# Patient Record
Sex: Female | Born: 1965 | Race: White | Hispanic: No | Marital: Single | Smoking: Never smoker
Health system: Southern US, Community
[De-identification: ages and names within clinical notes are randomized; demographics above are authoritative.]

## PROBLEM LIST (undated history)

## (undated) DIAGNOSIS — I10 Essential (primary) hypertension: Secondary | ICD-10-CM

## (undated) DIAGNOSIS — J45909 Unspecified asthma, uncomplicated: Secondary | ICD-10-CM

## (undated) DIAGNOSIS — E079 Disorder of thyroid, unspecified: Secondary | ICD-10-CM

## (undated) HISTORY — DX: Unspecified asthma, uncomplicated: J45.909

## (undated) HISTORY — PX: PARATHYROIDECTOMY: SHX19

---

## 2002-05-17 ENCOUNTER — Emergency Department (HOSPITAL_COMMUNITY): Admission: EM | Admit: 2002-05-17 | Discharge: 2002-05-17 | Payer: Self-pay | Admitting: Emergency Medicine

## 2002-05-18 ENCOUNTER — Emergency Department (HOSPITAL_COMMUNITY): Admission: EM | Admit: 2002-05-18 | Discharge: 2002-05-18 | Payer: Self-pay | Admitting: Emergency Medicine

## 2002-05-19 ENCOUNTER — Encounter: Payer: Self-pay | Admitting: Emergency Medicine

## 2002-10-04 ENCOUNTER — Ambulatory Visit (HOSPITAL_COMMUNITY): Admission: RE | Admit: 2002-10-04 | Discharge: 2002-10-04 | Payer: Self-pay | Admitting: Endocrinology

## 2002-10-04 ENCOUNTER — Encounter: Payer: Self-pay | Admitting: Endocrinology

## 2003-01-27 ENCOUNTER — Encounter: Payer: Self-pay | Admitting: Urology

## 2003-02-01 ENCOUNTER — Inpatient Hospital Stay (HOSPITAL_COMMUNITY): Admission: RE | Admit: 2003-02-01 | Discharge: 2003-02-03 | Payer: Self-pay | Admitting: Urology

## 2003-02-01 ENCOUNTER — Encounter: Payer: Self-pay | Admitting: Urology

## 2003-02-05 ENCOUNTER — Emergency Department (HOSPITAL_COMMUNITY): Admission: EM | Admit: 2003-02-05 | Discharge: 2003-02-05 | Payer: Self-pay | Admitting: Emergency Medicine

## 2003-02-10 ENCOUNTER — Encounter: Payer: Self-pay | Admitting: Urology

## 2003-02-10 ENCOUNTER — Ambulatory Visit (HOSPITAL_BASED_OUTPATIENT_CLINIC_OR_DEPARTMENT_OTHER): Admission: RE | Admit: 2003-02-10 | Discharge: 2003-02-10 | Payer: Self-pay | Admitting: Urology

## 2003-03-04 ENCOUNTER — Ambulatory Visit (HOSPITAL_COMMUNITY): Admission: RE | Admit: 2003-03-04 | Discharge: 2003-03-04 | Payer: Self-pay | Admitting: Urology

## 2003-03-04 ENCOUNTER — Ambulatory Visit (HOSPITAL_BASED_OUTPATIENT_CLINIC_OR_DEPARTMENT_OTHER): Admission: RE | Admit: 2003-03-04 | Discharge: 2003-03-04 | Payer: Self-pay | Admitting: Urology

## 2003-03-08 ENCOUNTER — Encounter: Payer: Self-pay | Admitting: Surgery

## 2003-03-08 ENCOUNTER — Ambulatory Visit (HOSPITAL_COMMUNITY): Admission: RE | Admit: 2003-03-08 | Discharge: 2003-03-08 | Payer: Self-pay | Admitting: Neurology

## 2003-04-19 ENCOUNTER — Encounter (INDEPENDENT_AMBULATORY_CARE_PROVIDER_SITE_OTHER): Payer: Self-pay | Admitting: Specialist

## 2003-04-19 ENCOUNTER — Observation Stay (HOSPITAL_COMMUNITY): Admission: RE | Admit: 2003-04-19 | Discharge: 2003-04-20 | Payer: Self-pay | Admitting: Surgery

## 2003-09-19 ENCOUNTER — Ambulatory Visit (HOSPITAL_COMMUNITY): Admission: RE | Admit: 2003-09-19 | Discharge: 2003-09-19 | Payer: Self-pay | Admitting: Surgery

## 2003-10-05 ENCOUNTER — Ambulatory Visit (HOSPITAL_COMMUNITY): Admission: RE | Admit: 2003-10-05 | Discharge: 2003-10-05 | Payer: Self-pay | Admitting: Urology

## 2003-10-05 ENCOUNTER — Ambulatory Visit (HOSPITAL_BASED_OUTPATIENT_CLINIC_OR_DEPARTMENT_OTHER): Admission: RE | Admit: 2003-10-05 | Discharge: 2003-10-05 | Payer: Self-pay | Admitting: Urology

## 2003-10-10 ENCOUNTER — Ambulatory Visit (HOSPITAL_COMMUNITY): Admission: RE | Admit: 2003-10-10 | Discharge: 2003-10-10 | Payer: Self-pay | Admitting: Surgery

## 2003-11-16 ENCOUNTER — Ambulatory Visit (HOSPITAL_COMMUNITY): Admission: RE | Admit: 2003-11-16 | Discharge: 2003-11-16 | Payer: Self-pay | Admitting: Urology

## 2003-11-16 ENCOUNTER — Ambulatory Visit (HOSPITAL_BASED_OUTPATIENT_CLINIC_OR_DEPARTMENT_OTHER): Admission: RE | Admit: 2003-11-16 | Discharge: 2003-11-16 | Payer: Self-pay | Admitting: Urology

## 2003-11-27 ENCOUNTER — Emergency Department (HOSPITAL_COMMUNITY): Admission: EM | Admit: 2003-11-27 | Discharge: 2003-11-27 | Payer: Self-pay | Admitting: Emergency Medicine

## 2003-11-29 ENCOUNTER — Ambulatory Visit (HOSPITAL_BASED_OUTPATIENT_CLINIC_OR_DEPARTMENT_OTHER): Admission: RE | Admit: 2003-11-29 | Discharge: 2003-11-29 | Payer: Self-pay | Admitting: Urology

## 2003-11-30 ENCOUNTER — Observation Stay (HOSPITAL_COMMUNITY): Admission: RE | Admit: 2003-11-30 | Discharge: 2003-12-01 | Payer: Self-pay | Admitting: Surgery

## 2003-11-30 ENCOUNTER — Encounter (INDEPENDENT_AMBULATORY_CARE_PROVIDER_SITE_OTHER): Payer: Self-pay | Admitting: *Deleted

## 2003-12-03 ENCOUNTER — Inpatient Hospital Stay (HOSPITAL_COMMUNITY): Admission: EM | Admit: 2003-12-03 | Discharge: 2003-12-07 | Payer: Self-pay | Admitting: Emergency Medicine

## 2003-12-28 ENCOUNTER — Ambulatory Visit (HOSPITAL_BASED_OUTPATIENT_CLINIC_OR_DEPARTMENT_OTHER): Admission: RE | Admit: 2003-12-28 | Discharge: 2003-12-28 | Payer: Self-pay | Admitting: Urology

## 2003-12-29 ENCOUNTER — Inpatient Hospital Stay (HOSPITAL_COMMUNITY): Admission: EM | Admit: 2003-12-29 | Discharge: 2003-12-31 | Payer: Self-pay | Admitting: Emergency Medicine

## 2004-01-01 ENCOUNTER — Inpatient Hospital Stay (HOSPITAL_COMMUNITY): Admission: AD | Admit: 2004-01-01 | Discharge: 2004-01-05 | Payer: Self-pay | Admitting: Urology

## 2004-01-12 ENCOUNTER — Ambulatory Visit (HOSPITAL_COMMUNITY): Admission: RE | Admit: 2004-01-12 | Discharge: 2004-01-12 | Payer: Self-pay | Admitting: Urology

## 2004-01-19 ENCOUNTER — Inpatient Hospital Stay (HOSPITAL_COMMUNITY): Admission: EM | Admit: 2004-01-19 | Discharge: 2004-01-21 | Payer: Self-pay | Admitting: Urology

## 2004-02-20 ENCOUNTER — Inpatient Hospital Stay (HOSPITAL_COMMUNITY): Admission: RE | Admit: 2004-02-20 | Discharge: 2004-02-24 | Payer: Self-pay | Admitting: Urology

## 2004-06-23 ENCOUNTER — Ambulatory Visit (HOSPITAL_COMMUNITY): Admission: RE | Admit: 2004-06-23 | Discharge: 2004-06-23 | Payer: Self-pay | Admitting: Family Medicine

## 2007-12-25 ENCOUNTER — Emergency Department (HOSPITAL_COMMUNITY): Admission: EM | Admit: 2007-12-25 | Discharge: 2007-12-26 | Payer: Self-pay | Admitting: Emergency Medicine

## 2010-01-01 ENCOUNTER — Emergency Department (HOSPITAL_COMMUNITY): Admission: EM | Admit: 2010-01-01 | Discharge: 2010-01-01 | Payer: Self-pay | Admitting: Family Medicine

## 2010-07-01 ENCOUNTER — Encounter: Payer: Self-pay | Admitting: Neurosurgery

## 2010-08-25 LAB — URINE CULTURE

## 2010-08-25 LAB — POCT URINALYSIS DIP (DEVICE)
Bilirubin Urine: NEGATIVE
Glucose, UA: NEGATIVE mg/dL
Ketones, ur: NEGATIVE mg/dL
Protein, ur: NEGATIVE mg/dL
Urobilinogen, UA: 0.2 mg/dL (ref 0.0–1.0)

## 2010-10-26 NOTE — Op Note (Signed)
[name], [name]                             ACCOUNT NO.:  [id number]   MEDICAL RECORD NO.:  [id number]                   PATIENT TYPE:  OBV   LOCATION:  0444                                 FACILITY:  [facility]   PHYSICIAN:  Velora Heckler, M.D.                DATE OF BIRTH:  1965/08/05   DATE OF PROCEDURE:  04/19/2003  DATE OF DISCHARGE:                                 OPERATIVE REPORT   PREOPERATIVE DIAGNOSIS:  Primary hyperparathyroidism.   POSTOPERATIVE DIAGNOSIS:  Primary hyperparathyroidism.   OPERATION/PROCEDURE:  1. Neck exploration.  2. Left thyroid lobectomy.   SURGEON:  Velora Heckler, M.D.   ASSISTANT:  Adolph Pollack, M.D.   ANESTHESIA:  General.   ESTIMATED BLOOD LOSS:  50 mL.   PREPARATION:  Betadine.   COMPLICATIONS:  None.   INDICATIONS:  The patient is a 45 year old white female with primary  hyperparathyroidism.  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  She has had various urologic procedures.  She has  been evaluated by Dr. Ardyth Harps.  She was found to have an elevated serum  calcium level of 10.7 with an elevated intact PTH level of 118.  She  underwent Sestamibi scan.  This failed to localize parathyroid adenoma.  Followup laboratory studies on February 28, 2003 demonstrated an elevated  intact PTH level of 266 with a serum calcium level of 10.4.  MRI scan was  obtained.  This showed a 7 x 5 mm well-circumscribed nodule at the posterior  aspect of the upper lobe of the thyroid on the left side.  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  The patient is now  brought to the operating room for neck exploration.   FINDINGS:  At exploration, no such nodular density could be identified on  the left side of the neck.  A full exploration of the neck was carried out.  A normal-appearing left inferior parathyroid gland was identified.  A normal-  appearing right superior parathyroid gland  was identified.  Left thyroid  lobe was resected based on the absence of a left superior gland and the fact  that the MRI scan had indicated an abnormality in the left neck.   DESCRIPTION OF PROCEDURE:  The procedure was done in OR #6 at the [facility].  The patient was brought to the operating room and  placed in the supine position on the operating room table.  Following  administration of general anesthesia, the patient is positioned and then  prepped and draped in the usual strict aseptic fashion.   After ascertaining that an adequate level of anesthesia had been obtained, a  left neck incision is made in a skin fold for approximately 3 cm.  An  attempt is made to perform a minimally invasive approach. Dissection is  carried  down through the platysma.  Hemostasis is obtained with the  electrocautery.  Strap muscles are incised in the midline.  Strap muscles  are reflected off the left thyroid lobe.  Left thyroid lobe is mobilized.  The venous tributaries are divided between small Ligaclips.  Exploration  behind the left thyroid lobe failed to reveal any evidence of parathyroid  adenoma.  The decision was, therefore, made to proceed with full neck  exploration.  Incision was extended across the midline to the right side  along the natural skin lines.  Dissection was carried down through the  platysma on the right as well as the left.  Skin flaps were elevated  cephalad and caudad from the thyroid notch to the sternal notch.  A May-  Horner self-retaining retractor is placed for exposure.  Strap muscles are  again reflected to the left and the left thyroid lobe is fully dissected  out.  The left superior pole vessels are identified.  Dissection is carried  up onto the side of the larynx.  Carotid sheath is opened and explored.  Inferiorly the dissection is carried to the precervical fascia and behind  the esophagus.  Recurrent laryngeal nerve is identified and  preserved.  Inferior thyroid artery is identified and preserved.  Left superior pole of  the thymus is resected and submitted as a specimen.  No evidence of  parathyroid adenoma is identified in the left neck.  Just inferior to the  inferior thyroid artery appears to be a small, approximately 4 mm, normal-  appearing parathyroid gland.  Just superior to the artery, adherent to the  thyroid, is a small, apparent superior parathyroid gland.  However, on  frozen section biopsy by Dr. Tamala Fothergill, this appears to be thyroid tissue.   Next, we explored the right neck.  Right thyroid lobe appears normal.  It  was rolled medially.  Strap muscles are reflected laterally.  Venous  tributaries are divided between small Ligaclips.  Thorough exploration of  the right neck including the carotid sheath, the superior pole, the  retroesophageal space, and along the tracheoesophageal groove reveals no  evidence of parathyroid adenoma.  Further exploration in the left neck again  reveals no sign of the abnormalities seen on MRI scan.  Therefore, a  decision is made to proceed with left thyroid lobectomy.  Superior pole  vessels are ligated in continuity with 2-0 silk ties and medium Ligaclips  and divided.  Gland is rolled medially.  Branches of the inferior thyroid  artery are divided between small and medium Ligaclips.  Ligament of Allyson Sabal is  transected and hemostasis obtained with the electrocautery.  Care is taken  to preserve the recurrent laryngeal nerve throughout its course which is  well visualized.  Gland is rolled further anteriorly.  Inferior venous  tributaries are ligated with 3-0 silk tie and divided.  Gland is mobilized  across the midline under the isthmus and then divided between hemostats.  The remaining thyroid tissue is suture ligated with 3-0 Vicryl suture  ligatures.  Left thyroid lobe is sectioned on the table but no evidence of parathyroid tissue is grossly identified.  Specimen is  submitted to  pathology and discussed with Dr. Tamala Fothergill who will carefully analyze the  left thyroid lobe for evidence of ectopic parathyroid tissue.  Good  hemostasis is obtained.  Surgicel is placed in both sides of the neck over  the area of the recurrent laryngeal nerves.  Strap muscles are  reapproximated in the  midline with interrupted 3-0 Vicryl sutures.  Platysma  is closed with interrupted 3-0 Vicryl sutures.  Skin edges were  reapproximated with interrupted 4-0 Vicryl subcuticular sutures.  Wound was  washed and dried and Steri-Strips are applied.  Sterile gauze dressings are  applied.  The patient is awakened from anesthesia and brought to the  recovery room in stable condition.  The patient tolerated the procedure  well.                                               Velora Heckler, M.D.    TMG/MEDQ  D:  04/19/2003  T:  04/19/2003  Job:  [id number]   cc:   Windy Fast L. Ovidio Hanger, M.D.  [address]  [place]  [place] [address]  Fax: [phone]   Tera Mater. Evlyn Kanner, M.D.  [address]  [place]  [place] [address]  Fax: [phone]   Marjory Lies, M.D.  [address]  [place]  [place] [address]  Fax: [phone]

## 2010-10-26 NOTE — Op Note (Signed)
NAMERorie, [name]                             ACCOUNT NO.:  [id number]   MEDICAL RECORD NO.:  [id number]                   PATIENT TYPE:  AMB   LOCATION:  NESC                                 FACILITY:  [facility]   PHYSICIAN:  Ronald L. Ovidio Hanger, M.D.           DATE OF BIRTH:  04-12-1966   DATE OF PROCEDURE:  10/05/2003  DATE OF DISCHARGE:                                 OPERATIVE REPORT   PREOPERATIVE DIAGNOSIS:  Right nephrolithiasis with some calyceal  obstruction and intermittent pyelonephritis.   POSTOPERATIVE DIAGNOSIS:  Right nephrolithiasis with some calyceal  obstruction and intermittent pyelonephritis.   OPERATION/PROCEDURE:  1. Cystourethroscopy.  2. Placement of right double-J stent.   SURGEON:  Lucrezia Starch. Earlene Plater, M.D.   ANESTHESIA:  LMA.   ESTIMATED BLOOD LOSS:  Negligible.   TUBES:  26 cm x 7-French Cook double-pigtail stent.   COMPLICATIONS:  None.   INDICATIONS FOR PROCEDURE:  [name] is a lovely 45 year old white female  who presented with right nephrolithiasis and underwent right percutaneous  lithotripsy.  It was noted that the stones were regrowing on metabolic work-  up.  She was found to have hyperparathyroidism.  She has undergone a neck  exploration and the adenoma could not be found.  It has been found on  Sestamibi scan.  It appears the adenoma is in the sternal area.  She is to  undergo treatment for that but she has been having intermittent  pyelonephritis and some obstruction of the calyces and the kidney from the  stone which has grown.  It is felt that cysto stent placement is probably  indicated prior to proceeding with the parathyroid adenoma removal and then  once the hyperparathyroidism is under control, we can secondarily treat the  stone.   DESCRIPTION OF PROCEDURE:  The patient was placed in the supine position.  Under proper LMA anesthesia was placed in the dorsal lithotomy position,  prepped and draped with Betadine in the sterile  fashion.  Cystourethroscopy  was performed with a 22.5-French Olympus panendoscope.  Utilizing the 12-  degree and 70-degree lenses, bladder was carefully inspected.  Efflux of  clear urine was noted through the normally placed ureteral orifices  bilaterally.  A 0.038-French, Teflon-coated guidewire was placed into the  right renal pelvis and under  fluoroscopic guidance, a 26 cm x 7-French Cook double-pigtail stent was  placed and noted to be in good position in the right renal pelvis within the  bladder.  Efflux of clear urine was noted from it.  The bladder was drained.  The panendoscope was removed and the patient was taken to the recovery room  stable.  Ronald L. Ovidio Hanger, M.D.    RLD/MEDQ  D:  10/05/2003  T:  10/05/2003  Job:  [id number]

## 2010-10-26 NOTE — Op Note (Signed)
NAMEJAMARIA, AMBORN                             ACCOUNT NO.:  [id number]   MEDICAL RECORD NO.:  [id number]                   PATIENT TYPE:  INP   LOCATION:  0354                                 FACILITY[facility]   PHYSICIAN:  Rozanna Boer., M.D.      DATE OF BIRTH:  30-Jan-1966   DATE OF PROCEDURE:  01/04/2004  DATE OF DISCHARGE:                                 OPERATIVE REPORT   PREOPERATIVE DIAGNOSES:  Bilateral renal stones, bilateral ureteral stents  with obstruction and azotemia.   POSTOPERATIVE DIAGNOSES:  Bilateral renal stones, bilateral ureteral stents  with obstruction and azotemia.   OPERATION:  Cysto and remove and replace bilateral ureteral stents.   ANESTHESIA:  General.   SURGEON:  Courtney Paris, M.D.   BRIEF HISTORY:  This 45 year old patient comes in with bilateral flank pain,  she was admitted July 20 for sounds like left pyelonephritis, had double J  ureteral stents inserted on July 19 by Dr. Darvin Neighbours after he did  ureteroscopy of the left side. She went home and came back 24 hours later  with increasing pain and the ultrasound and KUB shows that the stent on the  right was coiled up in the proximal ureter and with her creatinine going  from 1.9 up to 2.5, I felt that there was at least some obstruction on the  right side and needed to have the stents changed. She was also having a lot  of pain requiring Dilaudid PCA also indicating possibly there was a problem  with the left stent as well.   The patient was placed on the operating table in dorsal lithotomy position  after satisfactory induction of general anesthesia was prepped and draped  with Betadine in the usual sterile fashion.  The panendoscope was inserted  and the stent coming from the right orifice seemed a little long but was  pulled out outside the urethral meatus and a guidewire was then passed up  the stent under fluoroscopy to the level of the kidney. The stent was then  removed.  A new 7 French x 26 cm length double J ureteral stent was then  passed over the guidewire under fluoroscopy. It seemed to coil up in the  proximal ureter and when I tried to adjust this, it had to be replaced. I  then grasped the stent, pulled it out and then over the cystoscope passed a  guidewire again, this time passed it over the cystoscope and was able to  position this looks like what would have been the renal pelvis on the right  side.  This little bit longer stent seemed to fit better than the one she  before which was coiled up in the proximal ureter.  The guidewire was then  removed and stent applied and was in good position. The left stent was then  grasped with forceps and pulled outside the urethral meatus and again a  guidewire  was then passed over the stent and the stent was then removed. The  guidewire was then back loaded over the cystoscope and a 7 French x 24 cm  length double J ureteral stent was then passed up this side with the coil on  the renal pelvis and one in the bladder. It seemed to fit quite nicely.  The  stent seemed to be better positioned than they were previously and there was  look like some mucoid debris present in the holes of the stent on the left  side.  The bladder drained, scope removed, the patient taken to the recovery  area in good condition to be later taken up to the room and observed. If her  creatinine comes down and she feels better  without requiring a lot of pain medicines she can probably go home in the  morning. She did have hyperparathyroidism and had a parathyroid removed  under her sternum in June and her calcium remained low and this will be  watched as well.                                               Rozanna Boer., M.D.    HMK/MEDQ  D:  01/04/2004  T:  01/04/2004  Job:  [id number]

## 2010-10-26 NOTE — H&P (Signed)
[name], [name]                   ACCOUNT NO.:  [id number]   MEDICAL RECORD NO.:  [id number]          PATIENT TYPE:  INP   LOCATION:  0353                         FACILITY:  [facility]   PHYSICIAN:  Ronald L. Ovidio Hanger, M.D.DATE OF BIRTH:  [birth year]   DATE OF ADMISSION:  02/20/2004  DATE OF DISCHARGE:  02/24/2004                                HISTORY & PHYSICAL   CHIEF COMPLAINT:  I have kidney stone disease.   HISTORY OF PRESENT ILLNESS:  The patient is a 45 year old female with  history of hyperparathyroidism with subsequent bilateral nephrolithiasis.  She has undergone ureteroscopy extraction of stones on the left side.  She  has had a right percutaneous nephrolithotomy previously but the stones have  recurred and she has subsequently undergone a parathyroidectomy.  After  understanding the risks and alternatives, she would like to proceed with  right percutaneous nephrolithotomy.   PAST MEDICAL HISTORY:  She does have migraine headaches.  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  She has also suffered  pyelonephritis.   ALLERGIES:  SEPTRA, DARVOCET, has had some problems with DILAUDID.   MEDICATIONS:  Urocit-K, blood pressure pills, Levoxyl, Ciprofloxacin,  calcium, Tylox and morphine.   PAST SURGICAL HISTORY:  She has had multiple stone procedures as noted, a  parathyroidectomy as noted.   SOCIAL HISTORY:  She is negative smoker, negative drinker.   FAMILY HISTORY:  Non pertinent.   REVIEW OF SYMPTOMS:  She has had intermittent pyelonephritis as noted, right  flank pain, symptoms related to hypocalcemia that have been treated and  otherwise without other complaints.   PHYSICAL EXAMINATION:  VITAL SIGNS:  Blood pressure 140/100, pulse 79,  respirations 16, temperature 97.71F.  GENERAL:  She is a well-developed, well-nourished, well-groomed.  HEENT:  Normal.  NECK:  Without masses or thyromegaly.  CHEST:  Normal diaphragmatic motion.  ABDOMEN:   Soft, nontender without masses, organomegaly or hernia.  EXTREMITIES:  Normal.  NEUROLOGICAL:  Intact.  SKIN:  Normal.  MUSCULOSKELETAL:  She does have some tenderness in the right flank where the  percutaneous nephrostomy has been placed, otherwise without lesions.   IMPRESSION:  Nephrolithiasis.   PLAN:  Right percutaneous nephrolithotomy.      RLD/MEDQ  D:  03/04/2004  T:  03/04/2004  Job:  [id number]

## 2010-10-26 NOTE — Op Note (Signed)
NAMETERRINA, DOCTER                             ACCOUNT NO.:  [id number]   MEDICAL RECORD NO.:  [id number]                   PATIENT TYPE:  AMB   LOCATION:  NESC                                 FACILITY:  [facility]   PHYSICIAN:  Velora Heckler, M.D.                DATE OF BIRTH:  August 10, 1965   DATE OF PROCEDURE:  11/30/2003  DATE OF DISCHARGE:                                 OPERATIVE REPORT   PREOPERATIVE DIAGNOSIS:  Primary hyperparathyroidism.   POSTOPERATIVE DIAGNOSIS:  Primary hyperparathyroidism.   PROCEDURE:  1. Neck exploration.  2. 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  3. Partial thymectomy.   SURGEON:  Velora Heckler, M.D.   ASSISTANT:  Ines Bloomer, M.D.   ANESTHESIA:  General.   ESTIMATED BLOOD LOSS:  Minimal.   PREPARATION:  Betadine.   COMPLICATIONS:  None.   INDICATIONS FOR PROCEDURE:  The patient is a 45 year old white female with  primary hyperparathyroidism complicated by nephrolithiasis.  The patient had  undergone previous neck exploration for suspected parathyroid adenoma.  The  patient had a left superior and left inferior parathyroid gland identified.  A right superior gland was identified.  A right inferior gland was never  identified.  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  Postoperatively, the patient had recurrent hyperparathyroidism.  Sestamibi  scan demonstrated a suggestion of uptake in the anterior mediastinum.  MRI  scan of the chest showed a 7 mm density behind the manubrium.  The patient  now comes to surgery for neck exploration and possible sternal split with  thymectomy.   DESCRIPTION OF PROCEDURE:  The procedure was done in OR 7 at [facility].  The patient was brought to the operating room and placed  in a supine position on the operating table.  Following administration of  general anesthesia, the patient was prepped and draped in the usual strict  aseptic fashion.   After ascertaining an adequate level of anesthesia had  been obtained, a Kocher incision was reopened with a #10 blade.  Dissection  was carried down through subcutaneous tissues and platysma.  Hemostasis was  obtained with electrocautery.  Skin flaps were developed cephalad and caudad  from the thyroid notch to the sternal notch.  A Mahorner self-retaining  retractor was placed for exposure.  The strap muscles were incised in the  midline.  Dissection was carried down to the trachea.  The right thyroid  lobe was identified.  The left thyroid lobe is surgically absent.  Strap  muscles are reflected to the right exposing the entire right thyroid lobe.  The space behind the right thyroid lobe was not explored due to previous  right superior parathyroid gland and in the interest of avoiding a recurrent  laryngeal nerve injury.  Dissection was then carried inferiorly behind the  manubrium sterni.  Carotid artery on the right  is dissected out and followed  to the innominate artery.  Thymic tissue on the right side is carefully  mobilized.  Anterior compartment lymph nodes are mobilized off the trachea.  Dissection was carried down to the innominate vein which is dissected out  anteriorly and posteriorly.  Left thymic tissue had previously been resected  at her previous neck exploration.  Thymic tissue was then explored.  Just  above the level of the innominate vein, an area of tissue of differing color  from the surrounding thymus is identified.  This is gently dissected out and  hemostasis obtained with the electrocautery.  The lesion is in the proper  location to correspond to a sestamibi scan and MRI scan.  It is of the  appropriate size.  It is completely resected.  It measures approximately 8  by 5 by 4 mm in size.  It is submitted for histopathology and Dr. Berneta Levins confirms parathyroid tissue consistent with parathyroid adenoma.  The  remaining parathyroid tissue above the level of the  innominate vein is  completely resected by Dr. Edwyna Shell.  This is submitted as a separate  specimen.  Good hemostasis was noted.  The strap muscles were reapproximated  in the midline with with interrupted 3-0 Vicryl sutures.  The platysma was  reapproximated with interrupted 3-0 Vicryl sutures.  The skin was closed  with a running 4-0 Vicryl subcuticular suture.  The wound is washed and  dried and Benzoin and Steri-Strips are applied.  Sterile dressings are  applied.  The patient is awakened from anesthesia and brought to the  recovery room in stable condition.  The patient tolerated the procedure  well.                                               Velora Heckler, M.D.    TMG/MEDQ  D:  11/30/2003  T:  11/30/2003  Job:  [id number]   cc:   Ines Bloomer, M.D.  [address]  [place]  [place] [address]   Lucrezia Starch. Ovidio Hanger, M.D.  [address]  [place]  [place] [address]  Fax: [phone]   Marjory Lies, M.D.  [address]  [place]  [place] [address]  Fax: [phone]   Archie Endo, M.D.

## 2010-10-26 NOTE — H&P (Signed)
NAMEJULENA, [name]                             ACCOUNT NO.:  [id number]   MEDICAL RECORD NO.:  [id number]                   PATIENT TYPE:  INP   LOCATION:  0354                                 FACILITY:  [facility]   PHYSICIAN:  Rozanna Boer., M.D.      DATE OF BIRTH:  January 07, 1966   DATE OF ADMISSION:  01/01/2004  DATE OF DISCHARGE:                                HISTORY & PHYSICAL   This 45 year old white female was re-admitted at this time.  She just left  the hospital yesterday  morning after an admission on December 28, 2003 for what  sounds like pyelonephritis.  She has a long history of urinary tract calculi  and on December 27, 2003 underwent cystoscopy and Double-J catheter change by  Windy Fast L. Earlene Plater, M.D.  She was sent home but came back the next day with 102  fevers.  Apparently, the left stones were cleared out.  Although she was on  Cipro, she was febrile and was put on Rocephin and gentamicin.  Blood  culture and urine cultures turned out to be negative.  Her fever  defervesced, and she was sent home yesterday  with bilateral stents.  The  renal ultrasound on December 30, 2003 showed no hydronephrosis.  The stents were  in good position.  She has had low serum calcium levels and has been unable  to tolerate her p.o. 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  She was in the ER.  Her calcium was 6 on December 28, 2003.  She has been  unable to keep anything down the last 12 hours and has been throwing up and  was having some pretty severe back pain not relieved by [employer].  She  is admitted at this time for pain relief and to check her calcium and get  her metabolically stabilized if necessary.   PAST MEDICAL HISTORY:  She has had no cardiac symptoms.  She has had a right  percutaneous nephrolithotomy done in August of 2004.  She had stones that  began in her 69's.  She had five in three years, and then they stopped for a  while until two years ago.  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  She had  to be admitted for a calcium crisis episode just in June of 2004 right after  surgery.   HOSPITAL COURSE:  1. Calcitrol 1200 mg q.i.d.  2. Cipro 250 mg b.i.d.  3. Mepergan Forte for pain.  4. Potassium 8 mEq a day.  5. Thyroid medication.  6. Birth control.   ALLERGIES:  SEPTRA being a rash.   REVIEW OF SYSTEMS:  She has no GI complaints, but has not had a bowel  movement for a couple of days, no history of peptic ulcer disease, or heart,  or lung problems.  SOCIAL HISTORY:  She does not smoke or drink alcohol.   FAMILY HISTORY:  Noncontributory.   PHYSICAL EXAMINATION:  VITAL SIGNS:  Temperature 100.8, blood pressure  120/70, pulse 98, respirations 20.  GENERAL:  She is a strawberry blond, young white female complaining of pain  in her low back.  SKIN: Warm and dry.  CHEST:  Clear.  NECK:  Scars in her neck from previous surgery.  ABDOMEN:  Soft with some mild left greater than right CVA tenderness but no  masses.  PELVIC:  Deferred at this time.  EXTREMITIES:  Negative, no edema, good distal pulses, intact sensation to  light touch.   IMPRESSION:  1. Bilateral ureteral stents with recent pyelonephritis and persisting back     pain.  2. Nausea and vomiting.  3. Low calcium post parathyroid adenoma and partial thyroidectomy and     removal of the thymus in June.   RECOMMEND:  Will check her laboratory studies.  Probably need to give her  some calcium.  Get her pain under control.  Treat the nausea and vomiting,  and evaluate with another KUB and renal ultrasound in the a.m.                                               Rozanna Boer., M.D.    HMK/MEDQ  D:  01/01/2004  T:  01/01/2004  Job:  [id number]

## 2010-10-26 NOTE — Discharge Summary (Signed)
[name], [name]                             ACCOUNT NO.:  [id number]   MEDICAL RECORD NO.:  [id number]                   PATIENT TYPE:  INP   LOCATION:  0362                                 FACILITY:  [facility]   PHYSICIAN:  Ronald L. Ovidio Hanger, M.D.           DATE OF BIRTH:  02-10-1966   DATE OF ADMISSION:  02/01/2003  DATE OF DISCHARGE:  02/03/2003                                 DISCHARGE SUMMARY   DIAGNOSIS:  Right renal staghorn calculus.   OPERATIVE PROCEDURE:  Right percutaneous nephrostolithotomy on February 01, 2003.   HISTORY AND PHYSICAL EXAMINATION:  [name] is a lovely 45 year old white female  who presented with recurrent urinary tract infections and right flank pain.  She was subsequently found to have a right staghorn calculus, and after  understanding risks, benefits, and alternatives elected to proceed with  right percutaneous nephrostomy.  She has been properly treated with  antibiotics, and after a percutaneous nephrostomy had been placed earlier in  the day was admitted for treatment.   PAST MEDICAL HISTORY:  Please see history and physical for full details.   SOCIAL HISTORY:  Please see history and physical for full details.   FAMILY HISTORY:  Please see history and physical for full details.   REVIEW OF SYSTEMS:  Please see history and physical for full details.   PHYSICAL EXAMINATION:  VITAL SIGNS:  She is afebrile.  GENERAL:  Slightly obese, in no acute distress.  Oriented x3.  HEENT:  Normal.  NECK:  Without masses or thyromegaly.  CHEST:  Normal diaphragmatic motion.  ABDOMEN:  Soft, nontender, no mass, organomegaly, or hernias.  She does have  some right CVA tenderness, but no left CVA tenderness.  EXTREMITIES:  Normal.  NEUROLOGIC:  Intact.   HOSPITAL COURSE:  The patient was admitted.  After undergoing proper  preoperative evaluation, was subsequently taken to surgery on February 01, 2003, and underwent right percutaneous nephrostolithotomy  uneventfully.  Postoperatively, she initially did well and was without major complaints,  and by postop day #1, February 02, 2003, was afebrile, tolerating liquids.  She had no severe pain.  Her tubes were patent, but blood tinged, and her  CBC revealed there was no significant bleeding.  White blood cell count was  13.4, hemoglobin 11.8, and hematocrit 33.7.  As she continued to do well, by  February 03, 2003, was without major complaints.  The angiographic catheter  was removed and nephrostomy tube was maintained intact.  She was discharged  to follow up later in the week.  Doing well.   DISCHARGE MEDICATIONS:  1. Cipro.  2. Tylox.  3. A stool softener.   Additionally, she was seeing Dr. Darnell Level who is to follow up in the  office for possible hyperparathyroidism.   CONDITION ON DISCHARGE:  Improved.   Specific instructions were given.  Stone was sent for stone analysis.  Ronald L. Ovidio Hanger, M.D.    RLD/MEDQ  D:  02/22/2003  T:  02/22/2003  Job:  [id number]

## 2010-10-26 NOTE — H&P (Signed)
[name], [name]                             ACCOUNT NO.:  [id number]   MEDICAL RECORD NO.:  [id number]                   PATIENT TYPE:  INP   LOCATION:  0445                                 FACILITY:  [facility]   PHYSICIAN:  Maretta Bees. Vonita Moss, M.D.             DATE OF BIRTH:  24-May-1966   DATE OF ADMISSION:  12/28/2003  DATE OF DISCHARGE:                                HISTORY & PHYSICAL   HISTORY:  This 45 year old white female has a long history of urinary tract  calculi and yesterday underwent cystoscopy and double J catheter change by  Dr. Earlene Plater.  This morning she developed increasing left flank pain, fever to  101.9 along with some nausea and vomiting.  Her urine looked infected and  despite being on Cipro she was spiking fevers.  Urine was cultured and blood  cultures were done.  She was started on gentamicin and Rocephin and admitted  at this time.   Also in the emergency room laboratory studies showed a calcium 6.0 which is  lower than her usual level of 8.  She has had a history of a  parathyroidectomy and has not been able to tolerate her calcium  supplementation for the last few days.  She was given IV calcium in the  emergency room.   PAST MEDICAL HISTORY:  1. Migraine headaches.  2. She had a recent episode of rash from poison ivy.   MEDICATIONS:  1. Levoxyl 75 mg daily for hypothyroidism.  2. Pain medications as needed.  3. She has been on Cipro.  4. She also takes birth control pills.  5. Calcium 1200 mg q.i.d.   ALLERGIES:  SEPTRA, DARVOCET, DILAUDID.   HABITS:  She does not smoke or drink alcohol.   FAMILY HISTORY:  Noncontributory.   REVIEW OF SYSTEMS:  As noted in health history form.   PHYSICAL EXAMINATION:  VITAL SIGNS:  Blood pressure 113/70, pulse 88,  temperature on the floor came down to 98.8.  GENERAL:  She is alert and oriented.  SKIN:  Warm and dry.  ABDOMEN:  Slight guarding in the left flank.  Soft and nontender.  CHEST:  Clear.  HEART:   Tones are normal.  EXTREMITIES:  No lower extremity edema.   IMPRESSION:  1. Left pyelonephritis.  2. Urinary tract calculus disease.  3. Hypocalcemia.  4. Hypothyroidism.  5. History of parathyroidectomy.   PLAN:  1. Await cultures.  2. Intravenous gentamicin and Rocephin.  3. Restart calcium tablets and monitor serum calcium levels.                                               Maretta Bees. Vonita Moss, M.D.    LJP/MEDQ  D:  12/29/2003  T:  12/29/2003  Job:  [id number]

## 2010-10-26 NOTE — Discharge Summary (Signed)
[name], [name]                             ACCOUNT NO.:  [id number]   MEDICAL RECORD NO.:  [id number]                   PATIENT TYPE:  OIB   LOCATION:  0374                                 FACILITY:  [facility]   PHYSICIAN:  Velora Heckler, M.D.                DATE OF BIRTH:  04/23/1966   DATE OF ADMISSION:  12/03/2003  DATE OF DISCHARGE:  12/07/2003                                 DISCHARGE SUMMARY   REASON FOR ADMISSION:  Symptomatic hypocalcemia.   HISTORY OF PRESENT ILLNESS:  The patient is a 45 year old white female with  primary hyperparathyroidism.  The patient had undergone a neck exploration  at [facility] earlier in June.  She underwent excision of a  parathyroid adenoma from the anterior mediastinum.  The patient became  symptomatic with paresthesias.  These progressed to carpopedal spasm.  She  was admitted from the emergency department by Dr. Cyndia Bent for  treatment.   HOSPITAL COURSE:  The patient received intravenous calcium, magnesium, and  potassium supplementation with resolution of her symptoms.  She was followed  with daily laboratory studies.  She was started on aggressive oral  supplementation.  Electrolyte levels had stabilized by the fourth hospital  day and the patient was prepared for discharge.   DISCHARGE PLANNING:  The patient is discharged home on December 07, 2003 in good  condition.  Calcium level is 8.2, magnesium is 1.9, potassium level 3.4.  She will take calcium carbonate 1000 mg q.i.d., Rocaltrol 50 mcg b.i.d., and  potassium chloride 10 mEq daily.   She will have laboratory studies drawn as an outpatient in 48 hours.  She  will return to see me in the office in 1 week.   FINAL DIAGNOSIS:  Hypocalcemia following parathyroidectomy for primary  hyperparathyroidism.   CONDITION ON DISCHARGE:  Improved.                                               Velora Heckler, M.D.    TMG/MEDQ  D:  12/07/2003  T:  12/07/2003  Job:  [id number]

## 2010-10-26 NOTE — Discharge Summary (Signed)
[name], [name]                   ACCOUNT NO.:  [id number]   MEDICAL RECORD NO.:  [id number]          PATIENT TYPE:  INP   LOCATION:  0353                         FACILITY:  [facility]   PHYSICIAN:  Ronald L. Ovidio Hanger, M.D.DATE OF BIRTH:  10/27/1965   DATE OF ADMISSION:  02/20/2004  DATE OF DISCHARGE:  02/24/2004                                 DISCHARGE SUMMARY   DIAGNOSIS:  Right nephrolithiasis.   OPERATIVE PROCEDURE:  Right percutaneous nephrolithotomy on February 20, 2004.   HISTORY AND PHYSICAL EXAMINATION:  [name] is a 45 year old white female  with history of hyperparathyroidism and nephrolithiasis.  She has undergone  multiple procedures, undergone parathyroidectomy, and is for a right  percutaneous nephrolithotomy.  She understands risks, benefits, and  alternatives and wishes to proceed.   PAST MEDICAL HISTORY/SOCIAL HISTORY/FAMILY HISTORY/REVIEW OF SYSTEMS:  Please see history and physical examination for full detail.   PHYSICAL EXAMINATION:  TEMPERATURE:  She is afebrile.  VITAL SIGNS:  Stable.  GENERAL:  Well-nourished, well-developed, well-groomed, in mild distress.  Oriented x 3.  HEAD, EARS, NOSE, THROAT:  Normal.  NECK:  Without masses or thyromegaly.  CHEST:  Has normal diaphragmatic motion.  ABDOMEN:  Soft, nontender, without masses or organomegaly.  She does have  some right CVA tenderness, but a nephrostomy tube is in place.  EXTREMITIES:  Normal.  NEUROLOGIC:  Intact.  SKIN:  Normal.   HOSPITAL COURSE:  The patient was admitted.  After undergoing proper  preoperative evaluation, she was subsequently taken to surgery on February 20, 2004 and underwent a right percutaneous lithotripsy uneventfully.  Immediately postoperatively, she was doing well.  She had to be switched to  Dilaudid PCA due to some discomfort.  She had to have her nephrostomy tube  irrigated and had mild hematuria but it was of not great significance.  She  subsequently continued  to improve by postop day one.  She had some pain and  emesis x 1, but vital signs were stable.  Her Foley catheter was  discontinued, and Dr. Darnell Level was notified to help manage calcium.  Hemoglobin was stable at 10.4.  Creatinine was 1.2.  Diet was advanced.  She  progressively improved, and by February 22, 2004 had T-max of 101.4 degrees  Fahrenheit.  Chem-7 was essentially normal.  Thyroid studies were adequate.  She was changed to p.o. pain medications.  A nephrostogram was performed on  February 23, 2004, and the percutaneous nephrostomy was removed.  She  continued to progressively improved and was subsequently discharged on  February 24, 2004, voiding well, nephrostomy out.  There was a small amount  of drainage from the  nephrostomy tract.  Therefore, an ostomy bag was placed.  She was discharged  on Valium p.r.n. muscle spasms; Tylox; Cipro; and her Levoxyl.  She was to  follow up on Monday for a check and stent removal.   DISCHARGE CONDITION:  Improved.      RLD/MEDQ  D:  03/04/2004  T:  03/05/2004  Job:  [id number]

## 2010-10-26 NOTE — Discharge Summary (Signed)
[name], [name]                             ACCOUNT NO.:  [id number]   MEDICAL RECORD NO.:  [id number]                   PATIENT TYPE:  INP   LOCATION:  0470                                 FACILITY:  [facility]   PHYSICIAN:  Ronald L. Ovidio Hanger, M.D.           DATE OF BIRTH:  Oct 27, 1965   DATE OF ADMISSION:  12/28/2003  DATE OF DISCHARGE:  12/31/2003                                 DISCHARGE SUMMARY   DIAGNOSES:  1. Pyelonephritis.  2. Nephrolithiasis.  3. Hyperparathyroidism.   HISTORY AND PHYSICAL EXAMINATION:  [name] is a lovely 45 year old white female  with a long history of urinary tract calculi.  She has had placement of  double J stents and has had hyperparathyroidism.  She required two neck  explorations and recently has had a parathyroidectomy and has been dealing  with hypocalcemia and hypothyroidism.  She presented with increasing left  flank pain, fever to 101.9 degrees Fahrenheit, nausea and vomiting.  She was  seen by Dr. Vonita Moss in the emergency room, and she was admitted emergently.   Past medical history, social history, family history, review of systems,  please see signed patient medical history sheet for full details.   PHYSICAL EXAMINATION:  VITAL SIGNS:  Blood pressure is 113/70, pulse 88,  temperature is down to 98.8 degrees Fahrenheit.  GENERAL:  She is alert and oriented.  HEENT:  Head, ears, nose, and throat normal.  NECK:  All wounds are well-healed.  ABDOMEN:  Slight guarding, left flank.  Otherwise soft, nontender.  CHEST:  Clear.  CARDIAC:  Normal sinus rhythm without murmurs or gallops.  EXTREMITIES:  Normal.  NEUROLOGIC:  Intact.   HOSPITAL COURSE:  The patient was admitted, placed on pain medication, IV  fluids, and was given Zofran for pain, Rocephin and gentamicin IV.  Pertinent laboratory evaluation revealed a white blood cell count of 18.7,  H&H of 10.3/30.1, and a BMET which revealed a calcium of 6.0.  She was  subsequently placed on 600  mg of calcium plus vitamin D two q.i.d., calcium  gluconate IV, and Rocaltrol p.o.  She was in addition seen by Velora Heckler, M.D., in consultation, who performed the parathyroidectomy, who  managed her calcium.  By December 29, 2003, T-max was 101.3 degrees Fahrenheit,  she was feeling much better.  She had left lower quadrant colic, and it was  felt that she was most likely passing debris.  The calcium was definitely  improved.  By December 30, 2003, creatinine was slightly increased to 1.9.  There was much less pain; however, a renal ultrasound was performed to rule  out obstruction, and there was cortical atrophy of the right kidney and mild  dilatation of the upper pole collecting system and shadowing in the kidney,  but no obvious hydronephrosis was noted on either one.   She continued to improve, was afebrile, was subsequently discharged on December 31, 2003.  Discharge medications included Cipro, Mepergan Fortis, calcium  carbonate, Rocaltrol, and Tums.  She was to see Dr. Gerrit Friends on July 27  and was to follow up in nine days to see me, Dr. Earlene Plater, in the office.  Specific instructions were given.  Discharge condition was improved.  Of  note, cultures were negative except a urine culture, which did reveal  Enterococcus, which was sensitive to Macrodantin, and she was subsequently  began on that and will follow up in the office.                                               Ronald L. Ovidio Hanger, M.D.    RLD/MEDQ  D:  01/22/2004  T:  01/23/2004  Job:  [id number]

## 2010-10-26 NOTE — Op Note (Signed)
NAME:  [name], [name]                             ACCOUNT NO.:  [id number]   MEDICAL RECORD NO.:  [id number]                   PATIENT TYPE:  AMB   LOCATION:  [facility]                                 FACILITY:  [facility]   PHYSICIAN:  Ronald L. Ovidio Hanger, M.D.           DATE OF BIRTH:  12/15/1965   DATE OF PROCEDURE:  11/16/2003  DATE OF DISCHARGE:                                 OPERATIVE REPORT   DIAGNOSIS:  Calcified ureteral stent with hyperparathyroidism.   OPERATIVE PROCEDURE:  1. Cystourethroscopy.  2. Removal of calcified ureteral stent and stones.   SURGEON:  Lucrezia Starch. Earlene Plater, M.D.   ANESTHESIA:  LMA.   ESTIMATED BLOOD LOSS:  Negligible.   TUBES:  None.   COMPLICATIONS:  None.   INDICATIONS FOR PROCEDURE:  [name] is a very nice 45 year old white female,  present with hyperparathyroidism.  She has had a neck exploration with some  parathyroid tissue removed, and it is felt that she has most likely  substernal parathyroid adenoma.  She is to undergo an exploration, possible  median sternotomy later this month.  She has developed progressively  enlarging right renal calculi and had previous removal of the stones, but  the stones have recurred requiring placement of the stent approximately a  month ago.  She came to the office for a check.  She is having urgency,  frequency, and dysuria and on KUB, there is rapid growth of stone on the  distal end of the stent and also some growth on the proximal end of the  stent, obviously with highly lithogenic urine from the hyperparathyroidism.  After understanding risks, benefits, and alternatives elected to proceed  with cystoscopic removal of the stent.  We would like to leave it out until  her parathyroid adenoma has been resected later this month.   PROCEDURE IN DETAIL:  The patient was placed in a supine position.  After  proper LMA anesthesia, was placed in the dorsal lithotomy position and  prepped and draped with Betadine in a sterile  fashion.  Cystourethroscopy  was performed with a 22.5 French Olympus panendoscope, utilizing the 12 and  70 degree lenses.  The bladder was carefully inspected.  No lesions were  noted except for the significant calcification noted on the stent which  appeared to be very new.  The stent was grasped and removed.  Some fragments  came within it.  Certain fragments had to be crunched in the bladder and  removed.  Again, the stent was totally intact, very soft, but with highly  lithogenic urine that obviously formed __________ stone.  The bladder was  drained.  The panendoscope was removed.  The patient was placed on potassium  citrate.  We will leave a stent out and await her procedure later this  month, and then we will plan on stone removing procedures in the right  kidney following resection of her parathyroid adenoma.  Ronald L. Ovidio Hanger, M.D.   RLD/MEDQ  D:  11/16/2003  T:  11/16/2003  Job:  [id number]

## 2010-10-26 NOTE — Op Note (Signed)
[name], [name]                             ACCOUNT NO.:  [id number]   MEDICAL RECORD NO.:  [id number]                   PATIENT TYPE:  INP   LOCATION:  0353                                 FACILITY:  [facility]   PHYSICIAN:  Ronald L. Earlene Plater, M.D.               DATE OF BIRTH:  02-08-1966   DATE OF PROCEDURE:  02/20/2004  DATE OF DISCHARGE:                                 OPERATIVE REPORT   PREOPERATIVE DIAGNOSIS:  Right nephrolithiasis.   POSTOPERATIVE DIAGNOSIS:  Right nephrolithiasis.   PROCEDURES PERFORMED:  1.  Percutaneous dilation of right nephrostomy tract.  2.  Right percutaneous nephrostolithotomy.   SURGEON:  Lucrezia Starch. Earlene Plater, M.D.   RESIDENT SURGEON:  Thyra Breed, M.D.   INTERVENTIONAL RADIOLOGIST:  Dr. Lyman Bishop   ANESTHESIA:  General endotracheal.   ESTIMATED BLOOD LOSS:  250 cc.   DRAINS:  A 16 French Foley catheter to straight drain.  A 22 French  nephrostomy tube to straight drain.   COMPLICATIONS:  None.   INDICATIONS FOR PROCEDURE:  [name] is a 45 year old female with a history  of hyperparathyroidism and subsequent bilateral nephrolithiasis.  In the  past, she has undergone ureteroscopy on her left collecting system for  ureteral nephrolithiasis.  This has been successfully managed, and now she  has a heavy stone burden in her right kidney with a right double-J stent in  place.  Her hyperparathyroidism is being managed by the general surgeons,  who have performed parathyroidectomy.  In fact, she is now on calcium  replacement for hyperparathyroidism.  At this time, we have offered to  provide a right percutaneous nephrostolithotomy for definitive surgical  management of removal of her right kidney stones.  [name] understands the  risks, benefits and alternatives of the procedure, including bleeding,  infection, damage to adjacent structures, future renal problems, loss of  kidney as well as risks of anesthesia.  Informed consent was obtained, and  she is willing to proceed.   PROCEDURE IN DETAIL:  Following identification of her arm bracelet, the  patient was brought to the operating room and placed in supine position.  Here, she received preoperative antibiotics and was underwent successful  induction of general anesthesia.  She was then moved to the prone position  with extreme care taken to pad all pressure points appropriately.  At this  time, she has had a right angiographic catheter exiting her flank previously  placed by the interventional radiologist into the right collecting system.  The angiographic catheter as well as her entire right flank was then prepped  with Betadine and draped in the usual sterile fashion.  Initial spot  fluoroscopy showed a large stone burden within the right renal pelvis as  well as some clustering of stones in a lower pole calyx, which was unable to  be accessed by the interventional radiologist.  The angiographic catheter  was seen to  be going down the ureter and traversed into the bladder.  The  interventional radiologist began by placing a super stiff guidewire down the  angiographic catheter and into the bladder under direct fluoroscopic  guidance.  The angiographic catheter was removed.  A Desilets-Hoffman  dilating sheath was then placed over the super-stiff guidewire and under  direct fluoroscopic guidance into the proximal ureter.  Once in place, the  outer sheath was removed, and a second super stiff guidewire was placed  through the sheath and into the bladder under direct fluoroscopic guidance.  At this time, there were two access wires transversing into the bladder, and  the sheath was removed.  The NephroMax balloon dilator was then placed over  one of the wires and carefully guided under fluoroscopic guidance into the  right renal pelvis.  The nephrostomy tract was then dilated to approximately  30 [place].  The sheath was then fed over the balloon and advanced into the  right renal  pelvis under direct fluoroscopic guidance.  The balloon was  deflated and removed.  We then placed the rigid nephroscope through the  sheath, and the three-prong grasper was used to remove any debris and waste  from the nephrostomy tract from the dilation.  This provided excellent  visualization of the stone as well as abundant matrix material, which  actually irrigated from the sheath quite easily.  The grasper was then used  to remove copious amounts of this matrix material with some stone fragments  within it.  This then identified an approximately 2 cm stone, which was too  large to fit through the sheath.  We then used a Lithoclast Ultra to further  fragment this stone.  In fact, the stone was so soft, the ultrasound portion  of the probe was all that was utilized, which nicely fragmented the stone  and suctioned the remaining fragments from the renal pelvis.  Once the stone  was removed in its entirety, we further searched the right renal pelvis as  best as possible with the rigid nephroscope.  Using spot fluoroscopy as well  as injecting contrast through the sheath, we tried to locate the  infundibulum into this lower pole calyx containing stone.  An angiographic  catheter was also utilized with a glide wire to try and traverse our way  into this calyx using fluoroscopy; however, there appeared to be no entry  point, as the nephrostogram would not fill out this lower pole calyx  containing stone.  There was some rather brisk bleeding within the renal  pelvis at this point.  Given the fact that we had taken care of all the  stone within our access site, we elected to place a nephrostomy tube in the  procedure.  A 22 French Foley catheter was then fed through the access  sheath into the renal pelvis.  The sheath was then peeled from around the  Foley catheter.  Under fluoroscopic guidance, we injected contrast into the Foley catheter and found it to be in good position within the right  renal  pelvis.  Approximately 2.5 cc of sterile fluid was then used to fill the  balloon to further fix the nephrostomy tube within the renal pelvis.  With  one super-stiff guidewire remaining, we placed an angiographic catheter over  the wire and advanced it into the bladder under direct fluoroscopic  guidance.  The guidewire was removed.  A 2-0 silk suture was then used to  affix both the catheter as well as  the nephrostomy tube and the angiographic  catheter to the patient's skin.  The nephrostomy tube was then irrigated of  any remaining blood clots.  Hemostasis appeared to be excellent at this  point.  The area surrounding the nephrostomy tube was then cleansed and  dried.  Sterile dressing was applied followed by Tegaderm.  The patient  tolerated the procedure well, and there were no complications.  Dr. Gaynelle Arabian was present for the entire procedure, as he was the responsible  surgeon.   DISPOSITION:  After awakening from general anesthesia, the patient was  transported to the post anesthesia care unit in stable condition.  From  here, she will be transferred to the floor for further postoperative  observation.     Thyra Breed, MD                            Lucrezia Starch. Earlene Plater, M.D.    EG/MEDQ  D:  02/21/2004  T:  02/21/2004  Job:  [id number]

## 2010-10-26 NOTE — Discharge Summary (Signed)
NAMEKEVYN, WENGERT                             ACCOUNT NO.:  [id number]   MEDICAL RECORD NO.:  [id number]                   PATIENT TYPE:  INP   LOCATION:  0354                                 FACILITY:  [facility]   PHYSICIAN:  Rozanna Boer., M.D.      DATE OF BIRTH:  May 24, 1966   DATE OF ADMISSION:  01/01/2004  DATE OF DISCHARGE:  01/05/2004                                 DISCHARGE SUMMARY   DISCHARGE DIAGNOSES:  1. Bilateral renal stones.  2. Bilateral flank pain with obstruction.  3. 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  4. Nausea and vomiting.   OPERATIONS AND PROCEDURES:  Cystoscopy, bilateral stent removal, and  exchange on January 02, 2004.   HISTORY:  This 45 year old white female was readmitted just a day after  discharge with bilateral flank pain left greater than right.  Had nausea and  vomiting, some recent pyelonephritis after ureteroscopy with bilateral stent  placements by Dr. Earlene Plater on December 27, 2003.  She has had marked hypocalcemia  after parathyroid removal June 2005 and she is admitted now for treatment  and care.  She has been unable to keep anything down for the last 12 hours.   MEDICATIONS:  1. Mepergan Forte.  2. Cipro 250 b.i.d.  3. Calcitriol 1200 mg q.i.d.  4. Thyroid.  5. Birth control pills.  6. K-Dur.   ALLERGIES:  SEPTRA.   PAST MEDICAL HISTORY:  She has had several stones, five in three years in  her early 73s and then had numerous stones in the last two years.  She had a  partial thyroidectomy in November but then she persisted in having the  hyperparathyroidism.  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  Dr. Earlene Plater changed her stents on the 19th as above and  did ureteroscopy of the left side which she had an enterococcus UTI at that  time, then she was readmitted by Dr. Vonita Moss just on January 02, 2004.  She  was  sent home on the 23rd, but then came back with the above symptoms.   LABORATORIES:  Low potassium of 7.3.  Electrolytes were normal otherwise but  calcium was 7.0.  Her white count was 12,900.  Her hematocrit was low at  29%.  She remained afebrile but on PCA Dilaudid and Zofran her nausea and  vomiting was able to be controlled.  She remained afebrile.  Another  ultrasound and KUB showed that the stent on the right was coiled up in the  proximal right ureter and there was still some mild hydronephrosis  bilaterally.  Her creatinine was elevated at 2.5 and for this reason I  elected to go ahead and do a cystoscopy and I removed and changed both  ureteral stents on January 04, 2004.  I used a 7-French by  26 cm on the right  and a 7 x 24 cm stent on the left.  This seemed to have good drainage and  she would have no more pain afterwards.  Her creatinine dropped to 2.0 but  then her calcium was 7.9.  On discharge she will continue her calcitriol.  Because of the  enterococcus I put her on some Macrodantin 50 mg p.o. q.i.d.  She is also on  thyroid, birth control pills, and K-Dur.  She is scheduled for a lithotripsy  a week from today by Dr. Earlene Plater and will keep that appointment.  Sent home in  improved, ambulatory condition on a regular diet.                                               Rozanna Boer., M.D.    HMK/MEDQ  D:  01/05/2004  T:  01/05/2004  Job:  [id number]

## 2010-10-26 NOTE — Op Note (Signed)
NAMESEMIRA, STOLTZFUS                             ACCOUNT NO.:  [id number]   MEDICAL RECORD NO.:  [id number]                   PATIENT TYPE:  INP   LOCATION:  0362                                 FACILITY:  [facility]   PHYSICIAN:  Ronald L. Earlene Plater, M.D.               DATE OF BIRTH:  [birth year]   DATE OF PROCEDURE:  02/01/2003  DATE OF DISCHARGE:                                 OPERATIVE REPORT   PREOPERATIVE DIAGNOSIS:  Right staghorn renal calculus.   POSTOPERATIVE DIAGNOSIS:  Right staghorn renal calculus.   PROCEDURE:  Right percutaneous nephrostolithotomy.   SURGEON:  Lucrezia Starch. Earlene Plater, M.D.   ASSISTANT:  Susanne Borders, MD   ANESTHESIA:  General endotracheal.   DRAINS:  1. Foley catheter to straight drain.  2. Double-J ureteral stent, 8 French x 24 cm.  3. A 20 French council-tip catheter as right nephrostomy tube.   ESTIMATED BLOOD LOSS:  100 mL.   INDICATION FOR PROCEDURE:  Lucie is a 45 year old female, who has had  recurrent urinary tract infections and has had right flank pain.  She has  been evaluated with CT scan and found to have a large burden right renal  pelvic stone.  She has been worked up for hyperparathyroidism, and it is  thought that she does not have this.  Due to the large stone burden, she is  a good candidate for percutaneous nephrolithotomy.  All of the options have  been discussed with her including open surgery, pyelolithotomy, and  percutaneous nephrostolithotomy.  She has consented to percutaneous  nephrostolithotomy after understanding the risks and benefits.  She has been  on preventative Bactrim for some time.   DESCRIPTION OF PROCEDURE:  The patient underwent placement of a percutaneous  nephrostomy tube in interventional radiology earlier this morning. She was  later brought to the operating room and correctly identified by her  identification bracelet.  She was given ciprofloxacin preoperatively.  She  was given general anesthesia and placed in the  prone position with her right  flank slightly up.  She was prepped and draped in typical sterile fashion.  M. Ruel Favors, MD, from interventional radiology was present and converted  the right nephrostomy tube to a percutaneous access sheath through which the  nephroscope could be passed.  Once the access sheath was in good position by  fluoroscopy, the stone removal was begun.  A 28 French nephroscope was  passed through the access sheath and into the right renal collecting system.  The large stone burden was easily seen, and several fragments were grasped  with graspers and removed.  The ultrasonic lithoclasp was then used to break  up and suction the majority of the stone that was visualized on prior CT  scan.  There was an approximately 2 cm area of stone in a lower pole  parallel calix that could not be accessed with rigid nephroscope.  However,  the majority of patient's right collecting system was stone free at the end  of the procedure.  Certainly, there was were no stones that were visualized  through the nephroscope.  There was minimal bleeding throughout the case.  At the end of the stone extraction procedure, M. Ruel Favors, MD, passed an  angiographic catheter into the bladder for future access if necessary.  A 24  cm x 8 French double-J stent was also passed.  A 20 [place] council was  passed through the assess sheath into the collecting system and inflated  slightly with approximately 2 mL of normal saline.  A nephrogram was  performed which demonstrated minimal extravasation towards the lower pole.  The nephrogram confirmed excellent placement of the nephrostomy tube, the  stent, and the angiographic catheter.  The distal end of the double-J  ureteral stent did not quite form a complete curl in the bladder but did  form an excellent loop in the right renal pelvis.  The angiographic catheter  and nephrostomy tube were sutured to the skin with a 2-0 silk suture.  The  wound  was dressed, and the patient was awakened from her anesthesia without  complications.  She was taken to the postanesthesia care unit in stable  condition.  Please note that Windy Fast L. Earlene Plater, M.D. was present and  participated in the entire case, as he was the primary surgeon.     Susanne Borders, MD                           Lucrezia Starch. Earlene Plater, M.D.    DR/MEDQ  D:  02/01/2003  T:  02/01/2003  Job:  [id number]

## 2010-10-26 NOTE — Op Note (Signed)
NAMEDeina, [name]                             ACCOUNT NO.:  [id number]   MEDICAL RECORD NO.:  [id number]                   PATIENT TYPE:  AMB   LOCATION:  [facility]                                 FACILITY:  [facility]   PHYSICIAN:  Ronald L. Earlene Plater, M.D.               DATE OF BIRTH:  08-04-1965   DATE OF PROCEDURE:  12/28/2003  DATE OF DISCHARGE:                                 OPERATIVE REPORT   PREOPERATIVE DIAGNOSIS:  Bilateral nephrolithiasis.   POSTOPERATIVE DIAGNOSIS:  Bilateral nephrolithiasis.   PROCEDURES PERFORMED:  1. Cystoscopy.  2. Bilateral ureteroscopy.  3. Left ureteral laser lithotripsy.  4. Left ureteral stone extraction.  5. Bilateral double-J stent removal.  6. Bilateral replacement of 26 x 7 double-J ureteral stents.   SURGEON:  Lucrezia Starch. Earlene Plater, M.D.   RESIDENT SURGEON:  Thyra Breed, MD   ANESTHESIA:  General endotracheal.   DRAINS:  Bilateral 26 x 7 double-J ureteral stents.   COMPLICATIONS:  None.   INDICATIONS FOR PROCEDURE:  [name] is a 44 year old female with a history  of bilateral nephrolithiasis as a result of hyperparathyroidism.  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  In the past, she has  undergone stenting of both ureters as well as ESWL therapy; however, given  the resolution of her hyperparathyroidism, hopefully she will not continue  forming stones in this manner; therefore, she is consented on the risks,  benefits and alternatives of definitive stone management in the form of  ureteroscopy and laser lithotripsy of her left ureteral stone with possible  lithotripsy of some of her right renal stones.  Patient understands the  risks, benefits and consequences and is willing to proceed.   PROCEDURE IN DETAIL:  Following identification by her arm bracelets, the  patient was brought to the operating room and placed in the supine position.  Here, she underwent successful induction of general endotracheal anesthesia  and received  preoperative IV antibiotics.  She was then moved to the dorsal  lithotomy position.  Her perineum and genitalia were then prepped with  Betadine and draped in the usual sterile fashion.  A 12 degree cystoscopic  lens through a 22.5 [place] sheath was then inserted transurethrally and  cystourethroscopy identified a normal-appearing urethra.  Stents were seen  exiting with good curl from both the left and right ureteral orifices.  The  orifices were seen to be effluxing clear urine.  Further inspection of the  bladder with the 12 and 70 degree cystoscopic lenses revealed no evidence of  foreign body, stones, mucosal irregularities, or other abnormalities within  the bladder.  We then used the flexible stent-grasping forceps to pull the  distal-most aspect of the left ureteral stent to the urethra.  A 0.38 Teflon-  coated guidewire was then passed up the left ureteral stent under direct  fluoroscopic guidance until good curl was confirmed within the left renal  pelvis.  The stent was then removed over the wire.  We then used a semirigid  ureteroscope using saline as an irrigant to pass up the left ureter until we  encountered matrix material as well as what appeared to be a proximal left  ureteral stone of approximately 5-6 mm.  Using the holmium laser on settings  of 0.5 and 5, we then proceeded to perform lithotripsy of the ureteral stone  with its accompanying matrix material.  All personnel in the operating room  as well as the patient wore laser glasses.  The stone appeared to be mostly  matrix; therefore, a nitinol stone basket was used to grasp the mass of  matrix with intervening stone.  This was pulled from the patient's urethra  and sent for metabolic analysis.  Again, the ureteroscope was used to pass  up the ureter and the nitinol basket used to dislodge a fair amount of  matrix material within the left renal pelvis and the left proximal ureter.  We then used a 10 cc syringe to  gently irrigate the renal pelvis, thus  flushing the largest portion of the matrix material down the ureter.  Following these maneuvers, ureteroscopy showed there was very little  remaining matrix material in the renal pelvis or the ureter, the remainder  of which would easily pass with stent placement.  We then removed the  ureteroscope.  We placed a 7 x 26 double-J ureteral stent up the left ureter  under direct fluoroscopic guidance.  Once good curl was confirmed within the  left renal pelvis, we pulled the wire, and cystoscopically, there was  evidence of a good curl within the bladder.  We then turned our attention to  the right ureter.  The flexible stent-grasping forceps were again passed  through the working port of the cystoscope and used to grasp the distal-most  aspect of the right ureteral stent.  Once pulled to the urethral meatus, a  0.038 Teflon-coated guidewire was passed up the ureter until good curl was  confirmed within the right renal pelvis via fluoroscopy.  The right ureteral  stent was removed.  The semirigid ureteroscope was passed up the right  ureter to reveal no evidence of stone or matrix material.  There was a small  piece of stone material visible within the limits of the semirigid  ureteroscopes and the lower part of the right renal pelvis; however, given  the limitations of the semirigid ureteroscope, the full renal pelvis was not  evaluated.  In fact, at this time, it was judged that the best management  for the stones in the right renal pelvis would be via ESWL.  Therefore, we  removed the ureteroscope.  A 7 x 26 double-J ureteral stent was passed up on  the right.  Once good curl was confirmed proximally via fluoroscopy, the  wire was removed, and good curl was also confirmed cystoscopically within  the bladder.  The bladder was then drained.  Fluoroscopy once again  identified good curl within both renal pelves as well as the bladder.  The patient  tolerated the procedure well, and there were no complications.   Dr. Earlene Plater was present and participated in the entire procedure, as he was  the responsible surgeon.   DISPOSITION:  After awaking from general anesthesia, the patient was  transported to the post anesthesia care unit in stable condition.  From  here, she will be transferred to home.  She was given a prescription for  Tylox #  40 as well as Cipro to continue until completion of her treatment.  She was asked to stop her Toradol prior to ESWL.     Thyra Breed, MD                            Lucrezia Starch. Earlene Plater, M.D.    EG/MEDQ  D:  12/28/2003  T:  12/28/2003  Job:  [id number]

## 2010-10-26 NOTE — Op Note (Signed)
NAME:  Salay, Isha N                             ACCOUNT NO.:  [id number]   MEDICAL RECORD NO.:  [id number]                   PATIENT TYPE:  AMB   LOCATION:  [facility]                                 FACILITY:  [facility]   PHYSICIAN:  Ronald L. Earlene Plater, M.D.               DATE OF BIRTH:  05-09-1966   DATE OF PROCEDURE:  11/29/2003  DATE OF DISCHARGE:                                 OPERATIVE REPORT   PREOPERATIVE DIAGNOSIS:  Bilateral nephrolithiasis.   POSTOPERATIVE DIAGNOSIS:  Bilateral nephrolithiasis.   OPERATION/PROCEDURE:  1. Cystoscopy  2. Bilateral stent placement.   SURGEON:  Lucrezia Starch. Earlene Plater, M.D.   ASSISTANT:  Rhae Lerner, M.D.   ANESTHESIA:  General endotracheal anesthesia.   COMPLICATIONS:  None.   INDICATIONS FOR PROCEDURE:  Mrs. Pasch is a 45 year old female who presents  with the right staghorn calculus and right-sided hydronephrosis as well as a  6 x 4 mm stone on the left at the ureteropelvic junction causing mild left-  sided hydronephrosis.  Mrs. Broussard is scheduled to undergo exploration for a  parathyroid adenoma tomorrow.  Considering her unusual situation, we had  decided to proceed with cystoscopy and bilateral stent placement prior to  her chest procedure and have her follow up for definitive treatment of her  stones once she has recovered from her surgery.   DESCRIPTION OF PROCEDURE:  The patient was brought to the operating room and  following induction of general endotracheal anesthesia, was placed in the  dorsal lithotomy position and prepped and draped in the usual sterile  fashion.  A rigid cystoscope was subsequently passed through the patient's  urethra and bladder.  On entering the ladder, both right and left ureteral  orifices were identified.  A guidewire was subsequently passed through the  cystoscopy and used to intubate the right ureteral orifice.  The wire was  subsequently passed up through the level of the right renal pelvis under  fluoroscopy.  Cystoscope was subsequently removed and reinserted into the  bladder.  A second guidewire was then used to intubate the left ureteral  orifice and again this wire was passed up into the left renal pelvis.  Cystoscope was subsequently removed and a 7 x 26 double-J ureteral stent was  passed over the wire and up into the left collecting system with  fluoroscopic guidance.  Once the stent was positioned, the guidewire was  removed and curling in both the renal pelvis and bladder was confirmed  fluoroscopically.  This procedure was repeated on the right side using a 7 x  26 double-J ureteral stent.  Once both stents were in place, the bladder was  emptied, the cystoscope removed and the case was ended.  The patient  tolerated the procedure well and there were no complications.   Please note that Dr. Gaynelle Arabian was present for the entire case and  participated in all aspects  of the procedure.     Bailey Mech, MD                        Lucrezia Starch. Earlene Plater, M.D.    JP/MEDQ  D:  11/29/2003  T:  11/29/2003  Job:  [id number]

## 2010-10-26 NOTE — Op Note (Signed)
[name], [name]                             ACCOUNT NO.:  [id number]   MEDICAL RECORD NO.:  [id number]                   PATIENT TYPE:  AMB   LOCATION:  [facility]                                 FACILITY:  [facility]   PHYSICIAN:  Ronald L. Earlene Plater, M.D.               DATE OF BIRTH:  [birth year]   DATE OF PROCEDURE:  03/04/2003  DATE OF DISCHARGE:                                 OPERATIVE REPORT   PREOPERATIVE DIAGNOSIS:  Right renal and ureteral calculi.   POSTOPERATIVE DIAGNOSIS:  Right renal and ureteral calculi.   PROCEDURE:  Cystoscopy, right ureteroscopy with stone extraction, right  retrograde pyelogram with interpretation, right ureteral stent placement.   SURGEON:  Lucrezia Starch. Earlene Plater, M.D.   ASSISTANT:  Susanne Borders, MD   ANESTHESIA:  General endotracheal.   COMPLICATIONS:  None.   DRAINS:  A 24 cm x 8 [place] double J right ureteral stent.   DISPOSITION:  To post anesthesia care unit in stable condition.   INDICATIONS FOR PROCEDURE:  [name] is a 45 year old female with a history of a  right percutaneous nephrostolithotomy several weeks ago. She was to have a  second look procedure, however, her tube became dislodged. She did have a  double J stent that was placed in an antegrade fashion at the time of her  original surgery. 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 The patient was counselled as to the best treatment  options for treatment of the remainder of her stones and she decided to  undergo a retrograde ureteroscopic approach after understanding all the  risks and the benefits.   DESCRIPTION OF PROCEDURE:  The patient was brought to the operating room and  correctly identified by her identification bracelet. She was given general  endotracheal anesthesia and preoperative antibiotics. She was placed in the  dorsal lithotomy position and prepped and draped in typical sterile fashion.  The 12 degree cystoscope with 22 French sheath was  used to enter the  patient's urethra and bladder. The urethra was carefully surveyed and no  mucosal abnormalities were appreciated other than some bullous edema and  erythema around the right ureteral orifice secondary to the indwelling  ureteral stent. The left ureteral orifice was noted to be somewhat patulous.  Otherwise the bladder was without foreign bodies or other lesions. The  distal loop of the stent was grasped with flexible graspers and pulled  through the patient's meatus with cystoscope. The tip of the stent was  grasped between surgeon's finger and a 0.38 guidewire was passed over the  stent into the right renal pelvis. The stent was removed over the wire. Next  a dual lumen catheter was passed over the wire into the right renal  collecting system and was visualized under fluoroscopy. A second 0.38 mm  guidewire was then passed along side the original wire to have  a safety wire  as well as a working wire. A ureteral access sheath was then passed over one  of the wires and the wire was removed. A retrograde pyelogram was performed  through the access sheath and demonstrated the known large stone in the  right lower pole. There were some small defects in the middle and upper  poles consistent with known small stone fragments that are in these  locations. The inner cannula of the ureteral access sheath was removed and  the flexible ureteroscope was used to pass through the access sheath and  into the right renal pelvis. Much of the upper and middle pole stone  material was removed or irrigated through the access sheath. The rigid  ureteroscope was also used at one point to attempt to remove some of the  larger fragments in the upper pole. After all the larger material was  removed, there remained some very small dust like fragments that were left  intact. The flexible ureteroscope was used to manipulated into the lower  pole and there was a known large stone in this location.  Several attempts  were made to basket this stone and pull it into the right renal pelvis but  this was not possible given the size of the stone. Some other medium size  stones in the right lower pole were grasped and removed in their entirety  through the access sheath. After several minutes of removing stone material,  it was decided that the only remaining fragments were very small dust like  fragments that would likely pass on their own other than the right lower  pole stone which may be treated at a later day with lithotripsy. The access  sheath was removed an a 8 French x 24 cm double J ureteral stent was passed  over the guidewire. A good curl was seen in the right renal collecting  system under fluoroscopy and cystoscopy was used to visualize the distal  curl in the bladder. Of note, there were numerous small stone fragments in  the bladder that had passed through the ureter while we were doing the  procedure, indicating that most of the remaining stone material will likely  pass on its own. The patient was then awakened from anesthesia without  complications. She was taken to the post anesthesia care unit in stable  condition. Please note that Dr. Earlene Plater. was present and participated in all  aspects of this case as he was the primary surgeon.     Susanne Borders, MD                           Lucrezia Starch. Earlene Plater, M.D.    DR/MEDQ  D:  03/04/2003  T:  03/05/2003  Job:  [id number]

## 2011-03-08 LAB — URINALYSIS, ROUTINE W REFLEX MICROSCOPIC
Bilirubin Urine: NEGATIVE
Ketones, ur: NEGATIVE
Specific Gravity, Urine: 1.014

## 2011-03-08 LAB — POCT I-STAT, CHEM 8
Calcium, Ion: 1.03 — ABNORMAL LOW
Glucose, Bld: 116 — ABNORMAL HIGH
HCT: 40
Hemoglobin: 13.6
Potassium: 3.4 — ABNORMAL LOW

## 2011-03-08 LAB — DIFFERENTIAL
Monocytes Absolute: 0.8
Neutrophils Relative %: 85 — ABNORMAL HIGH

## 2011-03-08 LAB — CBC
Hemoglobin: 13.5
MCHC: 33.7
Platelets: 160
RBC: 4.55
RDW: 13.2
WBC: 12.3 — ABNORMAL HIGH

## 2011-03-08 LAB — URINE CULTURE: Colony Count: 75000

## 2011-03-08 LAB — POCT PREGNANCY, URINE
Operator id: 261601
Preg Test, Ur: NEGATIVE

## 2011-03-08 LAB — URINE MICROSCOPIC-ADD ON

## 2012-07-21 ENCOUNTER — Other Ambulatory Visit: Payer: Self-pay | Admitting: Obstetrics & Gynecology

## 2012-09-11 ENCOUNTER — Other Ambulatory Visit: Payer: Self-pay | Admitting: Obstetrics and Gynecology

## 2012-09-25 ENCOUNTER — Other Ambulatory Visit: Payer: Self-pay | Admitting: Obstetrics and Gynecology

## 2012-09-25 DIAGNOSIS — R928 Other abnormal and inconclusive findings on diagnostic imaging of breast: Secondary | ICD-10-CM

## 2012-10-08 ENCOUNTER — Ambulatory Visit
Admission: RE | Admit: 2012-10-08 | Discharge: 2012-10-08 | Disposition: A | Discharge status: Home / Self Care | Payer: BC Managed Care – PPO | Source: Ambulatory Visit | Attending: Obstetrics and Gynecology | Admitting: Obstetrics and Gynecology

## 2012-10-08 DIAGNOSIS — R928 Other abnormal and inconclusive findings on diagnostic imaging of breast: Secondary | ICD-10-CM

## 2014-02-01 ENCOUNTER — Other Ambulatory Visit: Payer: Self-pay | Admitting: Obstetrics and Gynecology

## 2014-02-02 LAB — CYTOLOGY - PAP

## 2014-09-13 ENCOUNTER — Ambulatory Visit (HOSPITAL_COMMUNITY): Payer: PRIVATE HEALTH INSURANCE | Admitting: Physical Therapy

## 2014-09-28 ENCOUNTER — Ambulatory Visit (HOSPITAL_COMMUNITY): Payer: BLUE CROSS/BLUE SHIELD | Attending: Sports Medicine | Admitting: Physical Therapy

## 2014-09-28 DIAGNOSIS — M5382 Other specified dorsopathies, cervical region: Secondary | ICD-10-CM | POA: Insufficient documentation

## 2014-09-28 DIAGNOSIS — M545 Low back pain: Secondary | ICD-10-CM | POA: Diagnosis not present

## 2014-09-28 DIAGNOSIS — M436 Torticollis: Secondary | ICD-10-CM

## 2014-09-28 DIAGNOSIS — M25659 Stiffness of unspecified hip, not elsewhere classified: Secondary | ICD-10-CM | POA: Diagnosis not present

## 2014-09-28 DIAGNOSIS — M542 Cervicalgia: Secondary | ICD-10-CM | POA: Diagnosis not present

## 2014-09-28 DIAGNOSIS — M6281 Muscle weakness (generalized): Secondary | ICD-10-CM

## 2014-09-28 DIAGNOSIS — M5441 Lumbago with sciatica, right side: Secondary | ICD-10-CM
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
## 2014-09-29 NOTE — Therapy (Signed)
[facility] [address] [place], [place], [address] Phone: [phone]   Fax:  [phone]  Patient Details  Name: [name] MRN: [id number] Date of Birth: 02/28/1966 Referring Provider:  Pati GalloKramer, James, MD  Encounter Date: 09/28/2014          Work Restrictions  Patient is not to lift more than 15lb from the floor. Patient may carry 25lb as long as she is not picking it up and placing it onto the floor.  Restrictions to be applied for next 2 weeks 09/28/14 - 10/13/14 and will be revised pending improvement.   Jerilee FieldCash Rai Sinagra PT DPT [phone]  [facility] [address] [place], [place], [address] Phone: [phone]   Fax:  [phone]

## 2014-10-05 ENCOUNTER — Ambulatory Visit (HOSPITAL_COMMUNITY): Payer: BLUE CROSS/BLUE SHIELD | Admitting: Physical Therapy

## 2014-10-05 DIAGNOSIS — M5441 Lumbago with sciatica, right side: Secondary | ICD-10-CM

## 2014-10-05 DIAGNOSIS — M6281 Muscle weakness (generalized): Secondary | ICD-10-CM

## 2014-10-05 DIAGNOSIS — M436 Torticollis: Secondary | ICD-10-CM

## 2014-10-05 DIAGNOSIS — M542 Cervicalgia: Secondary | ICD-10-CM

## 2014-10-05 DIAGNOSIS — M25659 Stiffness of unspecified hip, not elsewhere classified: Secondary | ICD-10-CM

## 2014-10-05 DIAGNOSIS — M5382 Other specified dorsopathies, cervical region: Secondary | ICD-10-CM | POA: Diagnosis not present
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
## 2014-10-11 ENCOUNTER — Ambulatory Visit (HOSPITAL_COMMUNITY): Payer: BLUE CROSS/BLUE SHIELD | Attending: Sports Medicine | Admitting: Physical Therapy

## 2014-10-11 DIAGNOSIS — M542 Cervicalgia: Secondary | ICD-10-CM | POA: Insufficient documentation

## 2014-10-11 DIAGNOSIS — M25659 Stiffness of unspecified hip, not elsewhere classified: Secondary | ICD-10-CM

## 2014-10-11 DIAGNOSIS — M436 Torticollis: Secondary | ICD-10-CM

## 2014-10-11 DIAGNOSIS — M6281 Muscle weakness (generalized): Secondary | ICD-10-CM | POA: Diagnosis not present

## 2014-10-11 DIAGNOSIS — M545 Low back pain: Secondary | ICD-10-CM | POA: Diagnosis not present

## 2014-10-11 DIAGNOSIS — M5441 Lumbago with sciatica, right side: Secondary | ICD-10-CM

## 2014-10-11 DIAGNOSIS — M5382 Other specified dorsopathies, cervical region: Secondary | ICD-10-CM | POA: Insufficient documentation
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
## 2014-10-14 ENCOUNTER — Ambulatory Visit (HOSPITAL_COMMUNITY): Payer: BLUE CROSS/BLUE SHIELD | Admitting: Physical Therapy

## 2014-10-14 DIAGNOSIS — M436 Torticollis: Secondary | ICD-10-CM

## 2014-10-14 DIAGNOSIS — M6281 Muscle weakness (generalized): Secondary | ICD-10-CM

## 2014-10-14 DIAGNOSIS — M25659 Stiffness of unspecified hip, not elsewhere classified: Secondary | ICD-10-CM

## 2014-10-14 DIAGNOSIS — M542 Cervicalgia: Secondary | ICD-10-CM

## 2014-10-14 DIAGNOSIS — M5441 Lumbago with sciatica, right side: Secondary | ICD-10-CM

## 2014-10-14 DIAGNOSIS — M5382 Other specified dorsopathies, cervical region: Secondary | ICD-10-CM | POA: Diagnosis not present
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
## 2014-10-18 ENCOUNTER — Ambulatory Visit (HOSPITAL_COMMUNITY): Payer: BLUE CROSS/BLUE SHIELD

## 2014-10-18 DIAGNOSIS — M5441 Lumbago with sciatica, right side: Secondary | ICD-10-CM

## 2014-10-18 DIAGNOSIS — M542 Cervicalgia: Secondary | ICD-10-CM

## 2014-10-18 DIAGNOSIS — M25659 Stiffness of unspecified hip, not elsewhere classified: Secondary | ICD-10-CM

## 2014-10-18 DIAGNOSIS — M436 Torticollis: Secondary | ICD-10-CM

## 2014-10-18 DIAGNOSIS — M5382 Other specified dorsopathies, cervical region: Secondary | ICD-10-CM | POA: Diagnosis not present

## 2014-10-18 DIAGNOSIS — M6281 Muscle weakness (generalized): Secondary | ICD-10-CM
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
## 2014-10-20 ENCOUNTER — Ambulatory Visit (HOSPITAL_COMMUNITY): Payer: BLUE CROSS/BLUE SHIELD | Admitting: Physical Therapy

## 2014-10-20 DIAGNOSIS — M542 Cervicalgia: Secondary | ICD-10-CM

## 2014-10-20 DIAGNOSIS — M6281 Muscle weakness (generalized): Secondary | ICD-10-CM

## 2014-10-20 DIAGNOSIS — M5441 Lumbago with sciatica, right side: Secondary | ICD-10-CM

## 2014-10-20 DIAGNOSIS — M5382 Other specified dorsopathies, cervical region: Secondary | ICD-10-CM | POA: Diagnosis not present

## 2014-10-20 DIAGNOSIS — M436 Torticollis: Secondary | ICD-10-CM

## 2014-10-20 DIAGNOSIS — M25659 Stiffness of unspecified hip, not elsewhere classified: Secondary | ICD-10-CM
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
## 2014-10-25 ENCOUNTER — Encounter (HOSPITAL_COMMUNITY): Payer: Self-pay

## 2014-10-27 ENCOUNTER — Ambulatory Visit (HOSPITAL_COMMUNITY): Payer: BLUE CROSS/BLUE SHIELD | Admitting: Physical Therapy

## 2014-10-27 DIAGNOSIS — M25659 Stiffness of unspecified hip, not elsewhere classified: Secondary | ICD-10-CM

## 2014-10-27 DIAGNOSIS — M5382 Other specified dorsopathies, cervical region: Secondary | ICD-10-CM | POA: Diagnosis not present

## 2014-10-27 DIAGNOSIS — M5441 Lumbago with sciatica, right side: Secondary | ICD-10-CM

## 2014-10-27 DIAGNOSIS — M436 Torticollis: Secondary | ICD-10-CM

## 2014-10-27 DIAGNOSIS — M6281 Muscle weakness (generalized): Secondary | ICD-10-CM

## 2014-10-27 DIAGNOSIS — M542 Cervicalgia: Secondary | ICD-10-CM

## 2014-10-27 NOTE — Patient Instructions (Signed)
High Row: Single Arm   Face anchor in stride stance. Palm down, pull arm back while squeezing shoulder blades together. Repeat 10 times per set. Repeat with other arm. Do 2 sets per session. Do 3 sessions per week. Anchor Height: Chest  http://tub.exer.us/70   Copyright  VHI. All rights reserved.  Row: Mid-Range - Standing   With yellow band anchored at chest level, pull elbows backward, squeezing shoulder blades together. Keep head and spine neutral. Row 10 reps, 2 set, 3 times per week.   http://ss.exer.us/291   Copyright  VHI. All rights reserved.
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
## 2014-11-01 ENCOUNTER — Ambulatory Visit (HOSPITAL_COMMUNITY): Payer: BLUE CROSS/BLUE SHIELD | Admitting: Physical Therapy

## 2014-11-01 DIAGNOSIS — M5441 Lumbago with sciatica, right side: Secondary | ICD-10-CM

## 2014-11-01 DIAGNOSIS — M25659 Stiffness of unspecified hip, not elsewhere classified: Secondary | ICD-10-CM

## 2014-11-01 DIAGNOSIS — M5382 Other specified dorsopathies, cervical region: Secondary | ICD-10-CM | POA: Diagnosis not present

## 2014-11-01 DIAGNOSIS — M436 Torticollis: Secondary | ICD-10-CM

## 2014-11-01 DIAGNOSIS — M542 Cervicalgia: Secondary | ICD-10-CM

## 2014-11-01 DIAGNOSIS — M6281 Muscle weakness (generalized): Secondary | ICD-10-CM
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
## 2014-11-03 ENCOUNTER — Ambulatory Visit (HOSPITAL_COMMUNITY): Payer: BLUE CROSS/BLUE SHIELD | Admitting: Physical Therapy

## 2014-11-03 DIAGNOSIS — M25659 Stiffness of unspecified hip, not elsewhere classified: Secondary | ICD-10-CM

## 2014-11-03 DIAGNOSIS — M5441 Lumbago with sciatica, right side: Secondary | ICD-10-CM

## 2014-11-03 DIAGNOSIS — M5382 Other specified dorsopathies, cervical region: Secondary | ICD-10-CM | POA: Diagnosis not present

## 2014-11-03 DIAGNOSIS — M542 Cervicalgia: Secondary | ICD-10-CM

## 2014-11-03 DIAGNOSIS — M6281 Muscle weakness (generalized): Secondary | ICD-10-CM

## 2014-11-03 DIAGNOSIS — M436 Torticollis: Secondary | ICD-10-CM
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
## 2014-11-08 ENCOUNTER — Ambulatory Visit (HOSPITAL_COMMUNITY): Payer: BLUE CROSS/BLUE SHIELD | Admitting: Physical Therapy

## 2014-11-08 DIAGNOSIS — M6281 Muscle weakness (generalized): Secondary | ICD-10-CM

## 2014-11-08 DIAGNOSIS — M436 Torticollis: Secondary | ICD-10-CM

## 2014-11-08 DIAGNOSIS — M5382 Other specified dorsopathies, cervical region: Secondary | ICD-10-CM | POA: Diagnosis not present

## 2014-11-08 DIAGNOSIS — M25659 Stiffness of unspecified hip, not elsewhere classified: Secondary | ICD-10-CM

## 2014-11-08 DIAGNOSIS — M542 Cervicalgia: Secondary | ICD-10-CM

## 2014-11-08 DIAGNOSIS — M5441 Lumbago with sciatica, right side: Secondary | ICD-10-CM
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
## 2014-11-10 ENCOUNTER — Ambulatory Visit (HOSPITAL_COMMUNITY): Payer: BLUE CROSS/BLUE SHIELD | Attending: Sports Medicine

## 2014-11-10 ENCOUNTER — Telehealth (HOSPITAL_COMMUNITY): Payer: Self-pay

## 2014-11-10 DIAGNOSIS — M25659 Stiffness of unspecified hip, not elsewhere classified: Secondary | ICD-10-CM | POA: Insufficient documentation

## 2014-11-10 DIAGNOSIS — M5382 Other specified dorsopathies, cervical region: Secondary | ICD-10-CM | POA: Insufficient documentation

## 2014-11-10 DIAGNOSIS — M6281 Muscle weakness (generalized): Secondary | ICD-10-CM | POA: Insufficient documentation

## 2014-11-10 DIAGNOSIS — M545 Low back pain: Secondary | ICD-10-CM | POA: Insufficient documentation

## 2014-11-10 DIAGNOSIS — M542 Cervicalgia: Secondary | ICD-10-CM | POA: Insufficient documentation

## 2014-11-10 NOTE — Telephone Encounter (Signed)
No show, called and pt stated she called and left a message.  Pt sick to stomach with fever.  Pt reminder apt time and date.  Juel Burrowasey Jo Cockerham, PTA

## 2014-11-15 ENCOUNTER — Ambulatory Visit (HOSPITAL_COMMUNITY): Payer: BLUE CROSS/BLUE SHIELD | Admitting: Physical Therapy

## 2014-11-15 DIAGNOSIS — M542 Cervicalgia: Secondary | ICD-10-CM

## 2014-11-15 DIAGNOSIS — M25659 Stiffness of unspecified hip, not elsewhere classified: Secondary | ICD-10-CM | POA: Diagnosis not present

## 2014-11-15 DIAGNOSIS — M5382 Other specified dorsopathies, cervical region: Secondary | ICD-10-CM | POA: Diagnosis not present

## 2014-11-15 DIAGNOSIS — M6281 Muscle weakness (generalized): Secondary | ICD-10-CM

## 2014-11-15 DIAGNOSIS — M5441 Lumbago with sciatica, right side: Secondary | ICD-10-CM

## 2014-11-15 DIAGNOSIS — M545 Low back pain: Secondary | ICD-10-CM | POA: Diagnosis not present

## 2014-11-15 DIAGNOSIS — M436 Torticollis: Secondary | ICD-10-CM
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
## 2014-11-17 ENCOUNTER — Ambulatory Visit (HOSPITAL_COMMUNITY): Payer: BLUE CROSS/BLUE SHIELD

## 2014-11-17 DIAGNOSIS — M542 Cervicalgia: Secondary | ICD-10-CM

## 2014-11-17 DIAGNOSIS — M5441 Lumbago with sciatica, right side: Secondary | ICD-10-CM

## 2014-11-17 DIAGNOSIS — M25659 Stiffness of unspecified hip, not elsewhere classified: Secondary | ICD-10-CM

## 2014-11-17 DIAGNOSIS — M6281 Muscle weakness (generalized): Secondary | ICD-10-CM

## 2014-11-17 DIAGNOSIS — M5382 Other specified dorsopathies, cervical region: Secondary | ICD-10-CM | POA: Diagnosis not present

## 2014-11-17 DIAGNOSIS — M436 Torticollis: Secondary | ICD-10-CM
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
## 2014-11-22 ENCOUNTER — Ambulatory Visit (HOSPITAL_COMMUNITY): Payer: BLUE CROSS/BLUE SHIELD | Admitting: Physical Therapy

## 2014-11-22 DIAGNOSIS — M5441 Lumbago with sciatica, right side: Secondary | ICD-10-CM

## 2014-11-22 DIAGNOSIS — M6281 Muscle weakness (generalized): Secondary | ICD-10-CM

## 2014-11-22 DIAGNOSIS — M5382 Other specified dorsopathies, cervical region: Secondary | ICD-10-CM | POA: Diagnosis not present

## 2014-11-22 DIAGNOSIS — M25659 Stiffness of unspecified hip, not elsewhere classified: Secondary | ICD-10-CM

## 2014-11-22 DIAGNOSIS — M436 Torticollis: Secondary | ICD-10-CM

## 2014-11-22 DIAGNOSIS — M542 Cervicalgia: Secondary | ICD-10-CM
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
## 2014-12-14 ENCOUNTER — Encounter (HOSPITAL_COMMUNITY): Payer: Self-pay

## 2014-12-20 ENCOUNTER — Encounter (HOSPITAL_COMMUNITY): Payer: Self-pay | Admitting: Physical Therapy

## 2014-12-27 ENCOUNTER — Encounter (HOSPITAL_COMMUNITY): Payer: Self-pay | Admitting: Physical Therapy

## 2015-01-03 ENCOUNTER — Encounter (HOSPITAL_COMMUNITY): Payer: Self-pay | Admitting: Physical Therapy

## 2015-02-07 ENCOUNTER — Other Ambulatory Visit: Payer: Self-pay | Admitting: Obstetrics and Gynecology

## 2015-02-08 LAB — CYTOLOGY - PAP

## 2015-07-24 NOTE — Therapy (Signed)
[facility] [place], [place], [address] Phone: [phone]   Fax:  [phone]  Patient Details  Name: ENDA SANTO MRN: [id number] Date of Birth: Apr 12, 1966 Referring Provider:  Berle Mull, MD  Encounter Date: 07/24/2015  PHYSICAL THERAPY DISCHARGE SUMMARY  Visits from Start of Care: 13  Current functional level related to goals / functional outcomes: Has not returned since last skilled session.   Remaining deficits: N/A    Education / Equipment: N/A  Plan: Patient agrees to discharge.  Patient goals were partially met. Patient is being discharged due to not returning since the last visit.  ?????       Deniece Ree PT, DPT [facility] [address] [place], [place], [address] Phone: [phone]   Fax:  [phone]

## 2015-08-28 ENCOUNTER — Ambulatory Visit (INDEPENDENT_AMBULATORY_CARE_PROVIDER_SITE_OTHER): Payer: BLUE CROSS/BLUE SHIELD | Admitting: Pediatrics

## 2015-08-28 ENCOUNTER — Encounter: Payer: Self-pay | Admitting: Pediatrics

## 2015-08-28 VITALS — BP 130/74 | HR 76 | Resp 16

## 2015-08-28 DIAGNOSIS — J3089 Other allergic rhinitis: Secondary | ICD-10-CM

## 2015-08-28 DIAGNOSIS — T7800XA Anaphylactic reaction due to unspecified food, initial encounter: Secondary | ICD-10-CM

## 2015-08-28 MED ORDER — EPINEPHRINE 0.3 MG/0.3ML IJ SOAJ: INTRAMUSCULAR | Status: DC

## 2015-08-28 NOTE — Patient Instructions (Addendum)
Continue avoiding mammalian proteins I will call you when  I get the results of your alpha gal IgE to see if you  can add mammalian proteins into your diet  If you have an allergic reaction take Benadryl 50 mg every 4 hours and if you have life-threatening symptoms inject yourself with EpiPen 0.3 mg

## 2015-08-28 NOTE — Progress Notes (Signed)
  [address] [place] [address] Dept: [phone]  FOLLOW UP NOTE  Patient ID: [name], female    DOB: 09/07/1965  Age: 50 y.o. 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 She avoids all mammalian proteins. She has not had an allergic reaction in the past year. She is not having any asthmatic symptoms. Her nasal symptoms are under control  Current medications are cetirizine 10 mg once a day, fluticasone 2 sprays per nostril once a day if needed, Benadryl and EpiPen 0.3 mg if needed. Her other medications are outlined in the chart   Drug Allergies:  Allergies  Allergen Reactions  . Dilaudid [Hydromorphone Hcl]   . Other     ALPHA GAL RED MEATS  . Septra [Sulfamethoxazole-Trimethoprim]     Physical Exam: BP 130/74 mmHg  Pulse 76  Resp 16   Physical Exam  Constitutional: She is oriented to person, place, and time. She appears well-developed and well-nourished.  HENT:  Eyes normal. Ears normal. Nose normal. Pharynx normal.  Neck: Neck supple.  Cardiovascular:  S1 and S2 normal no murmurs  Pulmonary/Chest:  Clear to percussion and auscultation  Lymphadenopathy:    She has no cervical adenopathy.  Neurological: She is alert and oriented to person, place, and time.  Psychiatric: She has a normal mood and affect. Her behavior is normal. Judgment and thought content normal.  Vitals reviewed.   Diagnostics:  We will do an alpha gal IgE screen  Assessment and Plan: 1. Allergy with anaphylaxis due to food, initial encounter   2. Other allergic rhinitis     Meds ordered this encounter  Medications  . EPINEPHrine (EPIPEN 2-PAK) 0.3 mg/0.3 mL IJ SOAJ injection    Sig: USE AS DIRECTED FOR SEVERE ALLERGIC REACTION    Dispense:  2 Device    Refill:  2    Patient Instructions  Continue avoiding mammalian proteins I will call you when  I get the  results of your alpha gal IgE to see if you  can add mammalian proteins into your diet  If you have an allergic reaction take Benadryl 50 mg every 4 hours and if you have life-threatening symptoms inject yourself with EpiPen 0.3 mg    Return in about 1 year (around 08/27/2016).    Thank you for the opportunity to care for this patient.  Please do not hesitate to contact me with questions.  Tonette BihariJ. A. Bardelas, M.D.  [facility] [address] [place], [place] [address] [phone]

## 2015-09-12 ENCOUNTER — Other Ambulatory Visit: Payer: Self-pay | Admitting: Pediatrics

## 2015-09-12 DIAGNOSIS — T7800XA Anaphylactic reaction due to unspecified food, initial encounter: Secondary | ICD-10-CM | POA: Diagnosis not present

## 2015-09-13 ENCOUNTER — Encounter (HOSPITAL_COMMUNITY): Payer: Self-pay

## 2015-09-15 LAB — ALPHA-GAL PANEL
ALLERGEN, MUTTON, F88: 0.49 kU/L — AB
ALLERGEN, PORK, F26: 0.86 kU/L — AB
Beef: 1.01 kU/L — ABNORMAL HIGH
Galactose-alpha-1,3-galactose IgE: 2.25 kU/L — ABNORMAL HIGH (ref <0.35)

## 2015-10-03 DIAGNOSIS — I10 Essential (primary) hypertension: Secondary | ICD-10-CM | POA: Diagnosis not present

## 2015-10-03 DIAGNOSIS — R7309 Other abnormal glucose: Secondary | ICD-10-CM | POA: Diagnosis not present

## 2015-10-10 DIAGNOSIS — R7309 Other abnormal glucose: Secondary | ICD-10-CM | POA: Diagnosis not present

## 2015-10-10 DIAGNOSIS — F3340 Major depressive disorder, recurrent, in remission, unspecified: Secondary | ICD-10-CM | POA: Diagnosis not present

## 2015-10-10 DIAGNOSIS — I10 Essential (primary) hypertension: Secondary | ICD-10-CM | POA: Diagnosis not present

## 2015-10-10 DIAGNOSIS — E785 Hyperlipidemia, unspecified: Secondary | ICD-10-CM | POA: Diagnosis not present

## 2015-10-17 ENCOUNTER — Encounter (HOSPITAL_COMMUNITY): Payer: Self-pay | Admitting: Physical Therapy

## 2015-12-02 DIAGNOSIS — F4323 Adjustment disorder with mixed anxiety and depressed mood: Secondary | ICD-10-CM | POA: Diagnosis not present

## 2015-12-07 DIAGNOSIS — F4323 Adjustment disorder with mixed anxiety and depressed mood: Secondary | ICD-10-CM | POA: Diagnosis not present

## 2015-12-22 DIAGNOSIS — F4323 Adjustment disorder with mixed anxiety and depressed mood: Secondary | ICD-10-CM | POA: Diagnosis not present

## 2015-12-27 DIAGNOSIS — F4323 Adjustment disorder with mixed anxiety and depressed mood: Secondary | ICD-10-CM | POA: Diagnosis not present

## 2016-01-10 DIAGNOSIS — F4323 Adjustment disorder with mixed anxiety and depressed mood: Secondary | ICD-10-CM | POA: Diagnosis not present

## 2016-01-24 DIAGNOSIS — F4323 Adjustment disorder with mixed anxiety and depressed mood: Secondary | ICD-10-CM | POA: Diagnosis not present

## 2016-01-31 DIAGNOSIS — F4323 Adjustment disorder with mixed anxiety and depressed mood: Secondary | ICD-10-CM | POA: Diagnosis not present

## 2016-02-02 DIAGNOSIS — I10 Essential (primary) hypertension: Secondary | ICD-10-CM | POA: Diagnosis not present

## 2016-02-02 DIAGNOSIS — R7309 Other abnormal glucose: Secondary | ICD-10-CM | POA: Diagnosis not present

## 2016-02-09 DIAGNOSIS — E785 Hyperlipidemia, unspecified: Secondary | ICD-10-CM | POA: Diagnosis not present

## 2016-02-09 DIAGNOSIS — R7309 Other abnormal glucose: Secondary | ICD-10-CM | POA: Diagnosis not present

## 2016-02-09 DIAGNOSIS — I10 Essential (primary) hypertension: Secondary | ICD-10-CM | POA: Diagnosis not present

## 2016-02-09 DIAGNOSIS — E890 Postprocedural hypothyroidism: Secondary | ICD-10-CM | POA: Diagnosis not present

## 2016-02-14 DIAGNOSIS — F4323 Adjustment disorder with mixed anxiety and depressed mood: Secondary | ICD-10-CM | POA: Diagnosis not present

## 2016-03-13 DIAGNOSIS — F4323 Adjustment disorder with mixed anxiety and depressed mood: Secondary | ICD-10-CM | POA: Diagnosis not present

## 2016-03-20 ENCOUNTER — Other Ambulatory Visit: Payer: Self-pay | Admitting: Obstetrics and Gynecology

## 2016-03-20 DIAGNOSIS — Z01419 Encounter for gynecological examination (general) (routine) without abnormal findings: Secondary | ICD-10-CM | POA: Diagnosis not present

## 2016-03-20 DIAGNOSIS — Z6836 Body mass index (BMI) 36.0-36.9, adult: Secondary | ICD-10-CM | POA: Diagnosis not present

## 2016-03-20 DIAGNOSIS — Z124 Encounter for screening for malignant neoplasm of cervix: Secondary | ICD-10-CM | POA: Diagnosis not present

## 2016-03-20 DIAGNOSIS — Z1231 Encounter for screening mammogram for malignant neoplasm of breast: Secondary | ICD-10-CM | POA: Diagnosis not present

## 2016-03-21 LAB — CYTOLOGY - PAP

## 2016-04-03 DIAGNOSIS — F4323 Adjustment disorder with mixed anxiety and depressed mood: Secondary | ICD-10-CM | POA: Diagnosis not present

## 2016-04-21 DIAGNOSIS — F4323 Adjustment disorder with mixed anxiety and depressed mood: Secondary | ICD-10-CM | POA: Diagnosis not present

## 2016-05-15 DIAGNOSIS — F4323 Adjustment disorder with mixed anxiety and depressed mood: Secondary | ICD-10-CM | POA: Diagnosis not present

## 2016-05-21 DIAGNOSIS — F4323 Adjustment disorder with mixed anxiety and depressed mood: Secondary | ICD-10-CM | POA: Diagnosis not present

## 2016-05-23 DIAGNOSIS — R6882 Decreased libido: Secondary | ICD-10-CM | POA: Diagnosis not present

## 2016-05-23 DIAGNOSIS — Z6836 Body mass index (BMI) 36.0-36.9, adult: Secondary | ICD-10-CM | POA: Diagnosis not present

## 2016-05-29 DIAGNOSIS — F4323 Adjustment disorder with mixed anxiety and depressed mood: Secondary | ICD-10-CM | POA: Diagnosis not present

## 2016-06-19 DIAGNOSIS — F4323 Adjustment disorder with mixed anxiety and depressed mood: Secondary | ICD-10-CM | POA: Diagnosis not present

## 2016-07-17 DIAGNOSIS — F4323 Adjustment disorder with mixed anxiety and depressed mood: Secondary | ICD-10-CM | POA: Diagnosis not present

## 2016-07-29 ENCOUNTER — Encounter (HOSPITAL_COMMUNITY): Payer: Self-pay | Admitting: Emergency Medicine

## 2016-07-29 ENCOUNTER — Ambulatory Visit (HOSPITAL_COMMUNITY)
Admission: EM | Admit: 2016-07-29 | Discharge: 2016-07-29 | Disposition: A | Discharge status: Home / Self Care | Payer: BLUE CROSS/BLUE SHIELD | Attending: Internal Medicine | Admitting: Internal Medicine

## 2016-07-29 DIAGNOSIS — R05 Cough: Secondary | ICD-10-CM | POA: Diagnosis not present

## 2016-07-29 DIAGNOSIS — K2100 Gastro-esophageal reflux disease with esophagitis, without bleeding: Secondary | ICD-10-CM

## 2016-07-29 DIAGNOSIS — R062 Wheezing: Secondary | ICD-10-CM

## 2016-07-29 DIAGNOSIS — J40 Bronchitis, not specified as acute or chronic: Secondary | ICD-10-CM

## 2016-07-29 DIAGNOSIS — R059 Cough, unspecified: Secondary | ICD-10-CM

## 2016-07-29 HISTORY — DX: Essential (primary) hypertension: I10

## 2016-07-29 HISTORY — DX: Disorder of thyroid, unspecified: E07.9

## 2016-07-29 MED ORDER — BENZONATATE 100 MG PO CAPS: 200.0000 mg | ORAL_CAPSULE | Freq: Three times a day (TID) | ORAL | 0 refills | Status: DC | PRN

## 2016-07-29 MED ORDER — AZITHROMYCIN 250 MG PO TABS: 250.0000 mg | ORAL_TABLET | Freq: Every day | ORAL | 0 refills | Status: DC

## 2016-07-29 MED ORDER — METHYLPREDNISOLONE 4 MG PO TBPK: ORAL_TABLET | ORAL | 0 refills | Status: DC

## 2016-07-29 NOTE — ED Provider Notes (Signed)
CSN: [id number]     Arrival date & time 07/29/16  1123 History   First MD Initiated Contact with Patient 07/29/16 1316     Chief Complaint  Patient presents with  . Cough   (Consider location/radiation/quality/duration/timing/severity/associated sxs/prior Treatment) Patient c/o severe cough for 4 days.  She is wheezing.  She is also having some GERD problems.  She does have prilosec at home and also a albuterol MDI which she is not using.   The history is provided by the patient.  Cough  Cough characteristics:  Non-productive Onset quality:  Sudden Timing:  Constant Progression:  Worsening Chronicity:  New Smoker: no   Context: upper respiratory infection and weather changes   Relieved by:  Nothing Worsened by:  Deep breathing Ineffective treatments:  None tried Associated symptoms: wheezing     Past Medical History:  Diagnosis Date  . Hypertension   . Thyroid disease    Past Surgical History:  Procedure Laterality Date  . PARATHYROIDECTOMY     History reviewed. No pertinent family history. Social History  Substance Use Topics  . Smoking status: Never Smoker  . Smokeless tobacco: Never Used  . Alcohol use No   OB History    No data available     Review of Systems  Constitutional: Negative.   HENT: Positive for sneezing.   Eyes: Negative.   Respiratory: Positive for cough and wheezing.   Cardiovascular: Negative.   Gastrointestinal: Negative.   Endocrine: Negative.   Genitourinary: Negative.   Musculoskeletal: Negative.   Allergic/Immunologic: Negative.   Neurological: Negative.   Psychiatric/Behavioral: Negative.     Allergies  Dilaudid [hydromorphone hcl]; Other; and Septra [sulfamethoxazole-trimethoprim]  Home Medications   Prior to Admission medications   Medication Sig Start Date End Date Taking? Authorizing Provider  levothyroxine (SYNTHROID, LEVOTHROID) 50 MCG tablet Take 50 mcg by mouth daily before breakfast.   Yes Historical Provider, MD   lisinopril-hydrochlorothiazide (PRINZIDE,ZESTORETIC) 10-12.5 MG tablet Take 1 tablet by mouth daily.   Yes Historical Provider, MD  norethindrone (MICRONOR,CAMILA,ERRIN) 0.35 MG tablet Take 1 tablet by mouth daily.   Yes Historical Provider, MD  azithromycin (ZITHROMAX) 250 MG tablet Take 1 tablet (250 mg total) by mouth daily. Take first 2 tablets together, then 1 every day until finished. 07/29/16   Deatra Canter, FNP  benzonatate (TESSALON) 100 MG capsule Take 2 capsules (200 mg total) by mouth 3 (three) times daily as needed for cough. 07/29/16   Deatra Canter, FNP  methylPREDNISolone (MEDROL DOSEPAK) 4 MG TBPK tablet Take 6-5-4-3-2-1 po qd 07/29/16   Deatra Canter, FNP   Meds Ordered and Administered this Visit  Medications - No data to display  BP 125/67 (BP Location: Right Arm)   Pulse 94   Temp 98.4 F (36.9 C) (Oral)   Resp 16   SpO2 99%  No data found.   Physical Exam  Constitutional: She appears well-developed and well-nourished.  HENT:  Head: Normocephalic.  Right Ear: External ear normal.  Left Ear: External ear normal.  Mouth/Throat: Oropharynx is clear and moist.  Eyes: Conjunctivae and EOM are normal. Pupils are equal, round, and reactive to light.  Neck: Normal range of motion. Neck supple.  Cardiovascular: Normal rate, regular rhythm and normal heart sounds.   Pulmonary/Chest: Effort normal and breath sounds normal.  Abdominal: Soft. Bowel sounds are normal.  Nursing note and vitals reviewed.   Urgent Care Course     Procedures (including critical care time)  Labs Review Labs Reviewed -  No data to display  Imaging Review No results found.   Visual Acuity Review  Right Eye Distance:   Left Eye Distance:   Bilateral Distance:    Right Eye Near:   Left Eye Near:    Bilateral Near:         MDM   1. Bronchitis   2. Cough   3. Wheezing   4. GERD with esophagitis    Use albuterol MDI at home 3-4 times a day for next week Start  taking Prilosec from home qd Medrol dose pack Zpak Tessalon perles  Push po fluids, rest, tylenol and motrin otc prn as directed for fever, arthralgias, and myalgias.  Follow up prn if sx's continue or persist.    Deatra CanterWilliam J Lasharn Bufkin, FNP 07/29/16 1341

## 2016-07-29 NOTE — Discharge Instructions (Signed)
Use rescue inhaler 2-4 times a day to help reduce wheezing and cough symptoms. Take prilosec at home for Acid Reflux symptoms. Can also take zantac otc for reflux symptoms.

## 2016-07-29 NOTE — ED Triage Notes (Signed)
The patient presented to the [facility] with a complaint of a cough x 4 days. 

## 2016-07-31 ENCOUNTER — Encounter: Payer: Self-pay | Admitting: Allergy & Immunology

## 2016-07-31 ENCOUNTER — Ambulatory Visit (INDEPENDENT_AMBULATORY_CARE_PROVIDER_SITE_OTHER): Payer: BLUE CROSS/BLUE SHIELD | Admitting: Allergy & Immunology

## 2016-07-31 VITALS — BP 122/76 | HR 89 | Temp 97.6°F | Resp 18 | Ht 65.75 in | Wt 227.0 lb

## 2016-07-31 DIAGNOSIS — T781XXD Other adverse food reactions, not elsewhere classified, subsequent encounter: Secondary | ICD-10-CM

## 2016-07-31 DIAGNOSIS — J209 Acute bronchitis, unspecified: Secondary | ICD-10-CM | POA: Diagnosis not present

## 2016-07-31 DIAGNOSIS — K219 Gastro-esophageal reflux disease without esophagitis: Secondary | ICD-10-CM | POA: Diagnosis not present

## 2016-07-31 DIAGNOSIS — J4520 Mild intermittent asthma, uncomplicated: Secondary | ICD-10-CM

## 2016-07-31 DIAGNOSIS — F4323 Adjustment disorder with mixed anxiety and depressed mood: Secondary | ICD-10-CM | POA: Diagnosis not present

## 2016-07-31 MED ORDER — ALBUTEROL SULFATE HFA 108 (90 BASE) MCG/ACT IN AERS: 4.0000 | INHALATION_SPRAY | RESPIRATORY_TRACT | 3 refills | Status: AC | PRN

## 2016-07-31 MED ORDER — EPINEPHRINE 0.3 MG/0.3ML IJ SOAJ: 0.3000 mg | Freq: Once | INTRAMUSCULAR | 1 refills | Status: DC

## 2016-07-31 NOTE — Patient Instructions (Addendum)
1. Mild intermittent asthma, uncomplicated - Your lung function was normal.  - We gave you a sample of Arnuity (inhaled steroid).  - Use one puff twice daily for a couple of weeks until you get over your current illness. - If you are not starting to turn the corner by Friday, please start the systemic steroid that you have already picked up. - Daily controller medication(s): NONE - Rescue medications: ProAir 4 puffs every 4-6 hours as needed - Changes during respiratory infections or worsening symptoms: start Arunity 200mcg 1 puff twice daily for TWO WEEKS. - Asthma control goals:  * Full participation in all desired activities (may need albuterol before activity) * Albuterol use two time or less a week on average (not counting use with activity) * Cough interfering with sleep two time or less a month * Oral steroids no more than once a year * No hospitalizations  2. Adverse food reaction, subsequent encounter - We will send in a prescription for AuviQ (epinephrine injector). - They should call you in one day to confirm your address.  3. GERD - We will give you a sample of Dexilant 30mg  one tablet daily. - Let us know if you like this and we can send in a prescription.  4. Return in about 6 months (around 01/28/2017).  Please inform us of any Emergency Department visits, hospitalizations, or changes in symptoms. Call us before going to the ED for breathing or allergy symptoms since we might be able to fit you in for a sick visit. Feel free to contact us anytime with any questions, problems, or concerns.  It was a pleasure to meet you today! Best wishes in the [place] Year!   Websites that have reliable patient information: 1. American Academy of Asthma, Allergy, and Immunology: www.aaaai.org 2. Food Allergy Research and Education (FARE): foodallergy.org 3. Mothers of Asthmatics: http://www.asthmacommunitynetwork.org 4. American College of Allergy, Asthma, and Immunology:  www.acaai.org

## 2016-07-31 NOTE — Progress Notes (Signed)
FOLLOW UP  Date of Service/Encounter:  07/31/16   Assessment:   Mild intermittent asthma, uncomplicated  Adverse food reaction (alpha-gal sensitivity)  Acute bronchitis - on azithromycin  Gastroesophageal reflux disease    Plan/Recommendations:   1. Mild intermittent asthma, uncomplicated - [name]'s pattern on symptoms is still consistent with intermittent asthma. - [name]'s lung function was normal.  - Although she is wheezing today, she would prefer to stay off of systemic steroids.  - With that in mind, I did provide her with a Arnuity to use with respiratory flares.  0 I did ask that if she is not starting to turn the corner by Friday, please start the systemic steroids from Urgent Care. - Daily controller medication(s): NONE - Rescue medications: ProAir 4 puffs every 4-6 hours as needed - Changes during respiratory infections or worsening symptoms: start Arunity 1 puff twice daily for TWO WEEKS. - Asthma control goals:  * Full participation in all desired activities (may need albuterol before activity) * Albuterol use two time or less a week on average (not counting use with activity) * Cough interfering with sleep two time or less a month * Oral steroids no more than once a year * No hospitalizations  2. Adverse food reaction (alpha gal sensitivity) - We will send in a prescription for AuviQ (epinephrine injector). - This will be ideal since [name]'s income is rather limited.   3. GERD - We will give you a sample of Dexilant 30mg  one tablet daily since you are still having problems on omeprazole.  - Let us know if you like this and we can send in a prescription.  4. Return in about 6 months (around 01/28/2017).  Subjective:   [name] is a 51 y.o. female presenting today for follow up of  Chief Complaint  Patient presents with  . Bronchitis    Dx 07/29/16  . Cough    Follow up  . Wheezing    Follow up    [name] has a history of  the following: Patient Active Problem List   Diagnosis Date Noted  . 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 Other allergic rhinitis 08/28/2015    History obtained from: chart review and patient.  [name] was referred by Thayer Headings, MD.     [name] is a 51 y.o. female presenting for a follow up visit. She last saw Dr. Beaulah Dinning In March 2017. At that time, she was doing well with [place] meat avoidance. She had had no recent allergic reactions. Her asthma symptoms were under good control as were her nasal symptoms.  Since last visit, she has remained fairly stable. However, 2 days ago she was diagnosed with bronchitis. Prior to the diagnosis, was drinking lemonade and felt worsening reflux symptoms. Since then, she has been coughing considerably. At urgent care, she was given a steroid prescription as well as azithromycin. She has started the azithromycin is starting to fill somewhat better. However, she has avoided starting the steroids because she does not want to gain weight and does not feel good when she is on them. She does have an albuterol inhaler which was given to her couple of years ago. She has been using this recently with some resolution of her symptoms.  She does have a history of alpha gal sensitivity. She was diagnosed following stomach pain and rashes after exposures to red meat. She has avoided red meat since that time. She does not  currently have an up-to-date epinephrine autoinjector due to the cost. She tends to just take antihistamines when she thinks she might of been exposed to the red meats.   [name] also has a history of reflux. She is on omeprazole with some but not complete improvement in her symptoms. 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 Otherwise, there have been no changes to her past medical history, surgical history, family history, or social history.    Review of Systems: a 14-point review of systems  is pertinent for what is mentioned in HPI.  Otherwise, all other systems were negative. Constitutional: negative other than that listed in the HPI Eyes: negative other than that listed in the HPI Ears, nose, mouth, throat, and face: negative other than that listed in the HPI Respiratory: negative other than that listed in the HPI Cardiovascular: negative other than that listed in the HPI Gastrointestinal: negative other than that listed in the HPI Genitourinary: negative other than that listed in the HPI Integument: negative other than that listed in the HPI Hematologic: negative other than that listed in the HPI Musculoskeletal: negative other than that listed in the HPI Neurological: negative other than that listed in the HPI Allergy/Immunologic: negative other than that listed in the HPI    Objective:   Blood pressure 122/76, pulse 89, temperature 97.6 F (36.4 C), temperature source Oral, resp. rate 18, height 5' 5.75" (1.67 m), weight 227 lb (103 kg), SpO2 97 %. Body mass index is 36.92 kg/m.   Physical Exam:  General: Alert, interactive, in no acute distress. Cooperative with the exam.  Eyes: No conjunctival injection present on the right, No conjunctival injection present on the left, PERRL bilaterally, No discharge on the right, No discharge on the left and No Horner-Trantas dots present Ears: Right TM pearly gray with normal light reflex, Left TM pearly gray with normal light reflex, Right TM intact without perforation and Left TM intact without perforation.  Nose/Throat: External nose within normal limits and septum midline, turbinates edematous and pale with clear discharge, post-pharynx erythematous with cobblestoning in the posterior oropharynx. Tonsils 2+ without exudates Neck: Supple without thyromegaly. Lungs: Decreased breath sounds with expiratory wheezing bilaterally. No increased work of breathing. CV: Normal S1/S2, no murmurs. Capillary refill <2 seconds.   Skin: Warm and dry, without lesions or rashes. Neuro:   Grossly intact. No focal deficits appreciated. Responsive to questions.   Diagnostic studies:  Spirometry: results normal (FEV1: 2.44/86%, FVC: 2.94/85%, FEV1/FVC: 82%).    Spirometry consistent with normal pattern.   Allergy Studies: None     Malachi BondsJoel Markon Jares, MD [id number]

## 2016-08-08 DIAGNOSIS — N390 Urinary tract infection, site not specified: Secondary | ICD-10-CM | POA: Diagnosis not present

## 2016-08-08 DIAGNOSIS — Z0001 Encounter for general adult medical examination with abnormal findings: Secondary | ICD-10-CM | POA: Diagnosis not present

## 2016-08-08 DIAGNOSIS — R7309 Other abnormal glucose: Secondary | ICD-10-CM | POA: Diagnosis not present

## 2016-08-08 DIAGNOSIS — I10 Essential (primary) hypertension: Secondary | ICD-10-CM | POA: Diagnosis not present

## 2016-08-22 DIAGNOSIS — Z0000 Encounter for general adult medical examination without abnormal findings: Secondary | ICD-10-CM | POA: Diagnosis not present

## 2016-08-22 DIAGNOSIS — I10 Essential (primary) hypertension: Secondary | ICD-10-CM | POA: Diagnosis not present

## 2016-08-22 DIAGNOSIS — R7309 Other abnormal glucose: Secondary | ICD-10-CM | POA: Diagnosis not present

## 2016-08-22 DIAGNOSIS — F3340 Major depressive disorder, recurrent, in remission, unspecified: Secondary | ICD-10-CM | POA: Diagnosis not present

## 2016-09-06 DIAGNOSIS — F4323 Adjustment disorder with mixed anxiety and depressed mood: Secondary | ICD-10-CM | POA: Diagnosis not present

## 2016-09-18 DIAGNOSIS — F4323 Adjustment disorder with mixed anxiety and depressed mood: Secondary | ICD-10-CM | POA: Diagnosis not present

## 2016-10-02 DIAGNOSIS — F4323 Adjustment disorder with mixed anxiety and depressed mood: Secondary | ICD-10-CM | POA: Diagnosis not present

## 2016-10-16 DIAGNOSIS — F4323 Adjustment disorder with mixed anxiety and depressed mood: Secondary | ICD-10-CM | POA: Diagnosis not present

## 2016-10-27 DIAGNOSIS — F4323 Adjustment disorder with mixed anxiety and depressed mood: Secondary | ICD-10-CM | POA: Diagnosis not present

## 2016-11-06 DIAGNOSIS — F4323 Adjustment disorder with mixed anxiety and depressed mood: Secondary | ICD-10-CM | POA: Diagnosis not present

## 2016-12-18 DIAGNOSIS — F4323 Adjustment disorder with mixed anxiety and depressed mood: Secondary | ICD-10-CM | POA: Diagnosis not present

## 2016-12-31 DIAGNOSIS — F329 Major depressive disorder, single episode, unspecified: Secondary | ICD-10-CM | POA: Diagnosis not present

## 2016-12-31 DIAGNOSIS — Z6836 Body mass index (BMI) 36.0-36.9, adult: Secondary | ICD-10-CM | POA: Diagnosis not present

## 2017-01-01 DIAGNOSIS — F4323 Adjustment disorder with mixed anxiety and depressed mood: Secondary | ICD-10-CM | POA: Diagnosis not present

## 2017-01-19 DIAGNOSIS — F4323 Adjustment disorder with mixed anxiety and depressed mood: Secondary | ICD-10-CM | POA: Diagnosis not present

## 2017-01-28 ENCOUNTER — Encounter: Payer: Self-pay | Admitting: Allergy & Immunology

## 2017-01-28 ENCOUNTER — Ambulatory Visit: Payer: BLUE CROSS/BLUE SHIELD | Admitting: Allergy & Immunology

## 2017-01-28 ENCOUNTER — Ambulatory Visit (INDEPENDENT_AMBULATORY_CARE_PROVIDER_SITE_OTHER): Payer: BLUE CROSS/BLUE SHIELD | Admitting: Allergy & Immunology

## 2017-01-28 VITALS — BP 118/78 | HR 64 | Temp 97.6°F | Resp 16

## 2017-01-28 DIAGNOSIS — K219 Gastro-esophageal reflux disease without esophagitis: Secondary | ICD-10-CM | POA: Diagnosis not present

## 2017-01-28 DIAGNOSIS — J3089 Other allergic rhinitis: Secondary | ICD-10-CM | POA: Diagnosis not present

## 2017-01-28 DIAGNOSIS — J4520 Mild intermittent asthma, uncomplicated: Secondary | ICD-10-CM | POA: Diagnosis not present

## 2017-01-28 DIAGNOSIS — T781XXA Other adverse food reactions, not elsewhere classified, initial encounter: Secondary | ICD-10-CM | POA: Insufficient documentation

## 2017-01-28 DIAGNOSIS — T781XXD Other adverse food reactions, not elsewhere classified, subsequent encounter: Secondary | ICD-10-CM | POA: Diagnosis not present

## 2017-01-28 MED ORDER — DEXLANSOPRAZOLE 30 MG PO CPDR: 30.0000 mg | DELAYED_RELEASE_CAPSULE | Freq: Every day | ORAL | 5 refills | Status: DC

## 2017-01-28 NOTE — Progress Notes (Signed)
FOLLOW UP  Date of Service/Encounter:  01/28/17   Assessment:   Mild intermittent asthma, uncomplicated  Adverse food reaction (alpha-gal sensitivity)  Acute bronchitis - on azithromycin  Gastroesophageal reflux disease  Allergic rhinitis (horses, mold, dog)  Plan/Recommendations:   1. Mild intermittent asthma, uncomplicated - Your lung function was normal today.  - Continue with Arnuity during respiratory flares.  - Daily controller medication(s): NONE - Rescue medications: ProAir 4 puffs every 4-6 hours as needed - Changes during respiratory infections or worsening symptoms: start Arunity 1 puff twice daily for TWO WEEKS. - Asthma control goals:  * Full participation in all desired activities (may need albuterol before activity) * Albuterol use two time or less a week on average (not counting use with activity) * Cough interfering with sleep two time or less a month * Oral steroids no more than once a year * No hospitalizations  2. Adverse food reaction (alpha gal sensitivity) - We will send in a prescription for AuviQ (epinephrine injector). - They should call you in one day to confirm your address.  3. GERD - Start Dexilant 30mg  one tablet daily. - You can take Zantac (ranitidine) as needed for flares.   4. Return in about 6 months (around 07/31/2017).  Subjective:   [name] is a 51 y.o. female presenting today for follow up of No chief complaint on file.   [name] has a history of the following: Patient Active Problem List   Diagnosis Date Noted  . 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 Other allergic rhinitis 08/28/2015    History obtained from: chart review and patient, who is an absolute delight.  Eden Emms Primary Care Provider is Thayer Headings, MD.     [name] is a 51 y.o. female presenting for a follow up visit. She was last seen in February 2018. At that time, she was having symptoms consistent with coughing,  therefore I felt that her asthma was not under good control. I did provide Arnuity to use during respiratory flares to see if she would be willing to do this. She had prednisone that had been provided by Urgent Care, but she was refusing to start it. She has a history of alpha gal sensitivity and has AuviQ on hand in case.   Since the last visit, she has done well. She did end up using the prednisone at the last visit and she started the Arnuity for a short period of time. This did clear up her symptoms nicely. She did not have to use the Arnuity since that time. She denies coughing and wheezing associated with asthma, but she does endorse night time coughing, likely secondary to the reflux. She is able to sleep well and has not had to change her level of physical activity due to her respiratory symptoms. ACT today is 24, indicating excellent asthma control.   Reflux is not well controlled. She is currently using Dexilant, but only as needed. She has also been using omeprazole as well, but again this has been only as needed. She has Zantac that she intermittently uses. Triggering foods include dairy, caffeine, and chocolate as well as spicy foods. She was not under the impression that she had to use   She does have a history of allergic rhinitis, but does not take anything for it. She had testing performed in September 2014 that was positive to mold mix #2, Candida albicans, horse, as well as dog. She does have  horses as well as dogs. She does not want to take a nasal steroid and does not use any antihistamine regularly aside from PRN benadryl.   Otherwise, there have been no changes to her past medical history, surgical history, family history, or social history.    Review of Systems: a 14-point review of systems is pertinent for what is mentioned in HPI.  Otherwise, all other systems were negative. Constitutional: negative other than that listed in the HPI Eyes: negative other than that listed  in the HPI Ears, nose, mouth, throat, and face: negative other than that listed in the HPI Respiratory: negative other than that listed in the HPI Cardiovascular: negative other than that listed in the HPI Gastrointestinal: negative other than that listed in the HPI Genitourinary: negative other than that listed in the HPI Integument: negative other than that listed in the HPI Hematologic: negative other than that listed in the HPI Musculoskeletal: negative other than that listed in the HPI Neurological: negative other than that listed in the HPI Allergy/Immunologic: negative other than that listed in the HPI    Objective:   Blood pressure 118/78, pulse 64, temperature 97.6 F (36.4 C), temperature source Oral, resp. rate 16, SpO2 98 %. There is no height or weight on file to calculate BMI.   Physical Exam:  General: Alert, interactive, in no acute distress. Very pleasant and amusing. Quite talented at getting me sidetracked. Eyes: No conjunctival injection present on the right, No conjunctival injection present on the left, PERRL bilaterally, No discharge on the right, No discharge on the left and No Horner-Trantas dots present Ears: Right TM pearly gray with normal light reflex, Left TM pearly gray with normal light reflex, Right TM intact without perforation and Left TM intact without perforation.  Nose/Throat: External nose within normal limits and septum midline, turbinates edematous and pale with clear discharge, post-pharynx erythematous with cobblestoning in the posterior oropharynx. Tonsils 2+ without exudates Neck: Supple without thyromegaly. Lungs: Clear to auscultation without wheezing, rhonchi or rales. No increased work of breathing. CV: Normal S1/S2, no murmurs. Capillary refill <2 seconds.  Skin: Warm and dry, without lesions or rashes. Neuro:   Grossly intact. No focal deficits appreciated. Responsive to questions.   Diagnostic studies:   Spirometry: results  normal (FEV1: 2.79/99%, FVC: 3.47/106%, FEV1/FVC: 80%).    Spirometry consistent with normal pattern.   Allergy Studies: none     Malachi Bonds, MD [facility] [place]

## 2017-01-28 NOTE — Patient Instructions (Addendum)
1. Mild intermittent asthma, uncomplicated - Your lung function was normal today.  - Continue with Arnuity during respiratory flares.  - Daily controller medication(s): NONE - Rescue medications: ProAir 4 puffs every 4-6 hours as needed - Changes during respiratory infections or worsening symptoms: start Arunity 1 puff twice daily for TWO WEEKS. - Asthma control goals:  * Full participation in all desired activities (may need albuterol before activity) * Albuterol use two time or less a week on average (not counting use with activity) * Cough interfering with sleep two time or less a month * Oral steroids no more than once a year * No hospitalizations  2. Adverse food reaction (alpha gal sensitivity) - We will send in a prescription for AuviQ (epinephrine injector). - They should call you in one day to confirm your address.  3. GERD - Start Dexilant 30mg  one tablet daily. - You can take Zantac (ranitidine) as needed for flares.   4. Return in about 6 months (around 07/31/2017).   Please inform us of any Emergency Department visits, hospitalizations, or changes in symptoms. Call us before going to the ED for breathing or allergy symptoms since we might be able to fit you in for a sick visit. Feel free to contact us anytime with any questions, problems, or concerns.  It was a pleasure to see you again today! Enjoy the rest of your summer!   Websites that have reliable patient information: 1. American Academy of Asthma, Allergy, and Immunology: www.aaaai.org 2. Food Allergy Research and Education (FARE): foodallergy.org 3. Mothers of Asthmatics: http://www.asthmacommunitynetwork.org 4. American College of Allergy, Asthma, and Immunology: www.acaai.org   Election Day is coming up on Tuesday, November 6th! Make your voice heard! Register to vote at vote.org!

## 2017-01-30 ENCOUNTER — Other Ambulatory Visit: Payer: Self-pay

## 2017-01-30 MED ORDER — DEXLANSOPRAZOLE 30 MG PO CPDR: 30.0000 mg | DELAYED_RELEASE_CAPSULE | Freq: Every day | ORAL | 5 refills | Status: DC

## 2017-01-31 DIAGNOSIS — Z6834 Body mass index (BMI) 34.0-34.9, adult: Secondary | ICD-10-CM | POA: Diagnosis not present

## 2017-01-31 DIAGNOSIS — F329 Major depressive disorder, single episode, unspecified: Secondary | ICD-10-CM | POA: Diagnosis not present

## 2017-02-13 DIAGNOSIS — F4323 Adjustment disorder with mixed anxiety and depressed mood: Secondary | ICD-10-CM | POA: Diagnosis not present

## 2017-05-14 DIAGNOSIS — F4323 Adjustment disorder with mixed anxiety and depressed mood: Secondary | ICD-10-CM | POA: Diagnosis not present

## 2017-08-05 ENCOUNTER — Ambulatory Visit: Payer: BLUE CROSS/BLUE SHIELD | Admitting: Allergy & Immunology

## 2017-08-05 DIAGNOSIS — T781XXD Other adverse food reactions, not elsewhere classified, subsequent encounter: Secondary | ICD-10-CM | POA: Diagnosis not present

## 2017-08-05 DIAGNOSIS — J4520 Mild intermittent asthma, uncomplicated: Secondary | ICD-10-CM | POA: Diagnosis not present

## 2017-08-05 DIAGNOSIS — J3089 Other allergic rhinitis: Secondary | ICD-10-CM

## 2017-08-05 DIAGNOSIS — K219 Gastro-esophageal reflux disease without esophagitis: Secondary | ICD-10-CM | POA: Diagnosis not present

## 2017-08-05 MED ORDER — FLUTICASONE FUROATE 200 MCG/ACT IN AEPB: 1.0000 | INHALATION_SPRAY | Freq: Two times a day (BID) | RESPIRATORY_TRACT | 5 refills | Status: DC

## 2017-08-05 MED ORDER — EPINEPHRINE 0.3 MG/0.3ML IJ SOAJ: 0.3000 mg | Freq: Once | INTRAMUSCULAR | 1 refills | Status: DC

## 2017-08-05 MED ORDER — RANITIDINE HCL 300 MG PO CAPS: 300.0000 mg | ORAL_CAPSULE | Freq: Every evening | ORAL | 5 refills | Status: DC

## 2017-08-05 NOTE — Progress Notes (Signed)
FOLLOW UP  Date of Service/Encounter:  08/05/17   Assessment:   Mild intermittent asthma, uncomplicated  Adverse food reaction (alpha-gal sensitivity)  Gastroesophageal reflux disease  Allergic rhinitis (horses, mold, dog)  Plan/Recommendations:   1. Mild intermittent asthma, uncomplicated - Your lung function was normal today.  - Continue with Arnuity during respiratory flares.  - You can try using it nightly to see if that helps.  - Daily controller medication(s): NONE - Rescue medications: ProAir 4 puffs every 4-6 hours as needed - Changes during respiratory infections or worsening symptoms: start Arunity 1 puff twice daily for TWO WEEKS. - Asthma control goals:  * Full participation in all desired activities (may need albuterol before activity) * Albuterol use two time or less a week on average (not counting use with activity) * Cough interfering with sleep two time or less a month * Oral steroids no more than once a year * No hospitalizations  2. Adverse food reaction (alpha gal sensitivity) - We will send in a prescription for AuviQ (epinephrine injector). - They should call you in one day to confirm your address.  3. GERD - Take Zantac (ranitidine) 300mg  nightly. - We will try sending in a prescription to see if that helps.    4. Return in about 6 months (around 02/02/2018).  Subjective:   [name] is a 52 y.o. female presenting today for follow up of  Chief Complaint  Patient presents with  . Asthma  . Cough    [name] has a history of the following: Patient Active Problem List   Diagnosis Date Noted  . Mild intermittent asthma without complication 01/28/2017  . Gastroesophageal reflux disease 01/28/2017  . Adverse food reaction 01/28/2017  . 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 Perennial allergic rhinitis 08/28/2015    History obtained from: chart review and patient.  Eden Emms Primary Care Provider is  Thayer Headings, MD.     [name] is a 53 y.o. female presenting for a follow up visit. She was last seen in August 2018. At that time, her lung function was normal. We continued her on Arnuity one puff BID during respiratory flares. She was on nothing at baseline. She has a history of alpha gal sensitivity. We prescribed AuviQ to have on hand for allergic reactions. For her reflux, we started her on Dexilant 30mg  once daily as well as ranitidine as needed for flares. Overall, she is not really into medications and only wants to use them as needed.   Since the last visit, she has actually done fairly well, considering that fact that she tends not to take her medicines at all. She is having some coughing which has been ongoing for a period of almost two weeks. However she has not tried taking her rescue inhaler at all and has not been using her Arnuity at all. She is truly a minimalist at heart and wants to take nothing at all.   Her reflux has been somewhat irritating, but she has been adjusting her diet to try to control her symptoms. She was given Dexilant at some point, but she does not want to take PPIs. She does have Zantac that she takes only occasionally. She feels that her dietary changes have made a big change in her symptoms, however. She does continue to avoid red meat and is interested in retesting to see where her levels are at this time.   She does have a history of allergic  rhinitis, but does not take anything for it. She had testing performed in September 2014 that was positive to mold mix #2, Candida albicans, horse, as well as dog. She does have horses as well as dogs. She does not want to take a nasal steroid and does not use any antihistamine regularly aside from PRN benadryl.   Otherwise, there have been no changes to her past medical history, surgical history, family history, or social history. She continues to work at her [employer] and is trying to become an Barrister's clerkadjuster; however  this lateral move does not seem to be working. 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 She also is quite interested in chinchillas today and shows me some adorable pictures of these animals.     Review of Systems: a 14-point review of systems is pertinent for what is mentioned in HPI.  Otherwise, all other systems were negative. Constitutional: negative other than that listed in the HPI Eyes: negative other than that listed in the HPI Ears, nose, mouth, throat, and face: negative other than that listed in the HPI Respiratory: negative other than that listed in the HPI Cardiovascular: negative other than that listed in the HPI Gastrointestinal: negative other than that listed in the HPI Genitourinary: negative other than that listed in the HPI Integument: negative other than that listed in the HPI Hematologic: negative other than that listed in the HPI Musculoskeletal: negative other than that listed in the HPI Neurological: negative other than that listed in the HPI Allergy/Immunologic: negative other than that listed in the HPI    Objective:   Vital signs were normal.   Physical Exam:  General: Alert, interactive, in no acute distress. Pleasant.  Eyes: No conjunctival injection bilaterally, no discharge on the right, no discharge on the left and no Horner-Trantas dots present. PERRL bilaterally. EOMI without pain. No photophobia.  Ears: Right TM pearly gray with normal light reflex, Left TM pearly gray with normal light reflex, Right TM intact without perforation and Left TM intact without perforation.  Nose/Throat: External nose within normal limits and septum midline. Turbinates edematous and pale with clear discharge. Posterior oropharynx mildly erythematous without cobblestoning in the posterior oropharynx. Tonsils 2+ without exudates.  Tongue without thrush. Lungs: Clear to auscultation without wheezing, rhonchi  or rales. No increased work of breathing. CV: Normal S1/S2. No murmurs. Capillary refill <2 seconds.  Skin: Warm and dry, without lesions or rashes. Neuro:   Grossly intact. No focal deficits appreciated. Responsive to questions.  Diagnostic studies:   Spirometry: results normal (FEV1: 2.36/87%, FVC: 3.24/91%, FEV1/FVC: 72%).    Spirometry consistent with normal pattern.   Allergy Studies: none     Malachi BondsJoel Uthman Mroczkowski, MD [id number] [place]

## 2017-08-05 NOTE — Patient Instructions (Addendum)
1. Mild intermittent asthma, uncomplicated - Your lung function was normal today.  - Continue with Arnuity during respiratory flares.  - You can try using it nightly to see if that helps.  - Daily controller medication(s): NONE - Rescue medications: ProAir 4 puffs every 4-6 hours as needed - Changes during respiratory infections or worsening symptoms: start Arunity 200mcg 1 puff twice daily for TWO WEEKS. - Asthma control goals:  * Full participation in all desired activities (may need albuterol before activity) * Albuterol use two time or less a week on average (not counting use with activity) * Cough interfering with sleep two time or less a month * Oral steroids no more than once a year * No hospitalizations  2. Adverse food reaction (alpha gal sensitivity) - We will send in a prescription for AuviQ (epinephrine injector). - They should call you in one day to confirm your address.  3. GERD - Take Zantac (ranitidine) 300mg  nightly. - We will try sending in a prescription to see if that helps.    4. Return in about 6 months (around 02/02/2018).  Please inform us of any Emergency Department visits, hospitalizations, or changes in symptoms. Call us before going to the ED for breathing or allergy symptoms since we might be able to fit you in for a sick visit. Feel free to contact us anytime with any questions, problems, or concerns.  It was a pleasure to see you again today!   Websites that have reliable patient information: 1. American Academy of Asthma, Allergy, and Immunology: www.aaaai.org 2. Food Allergy Research and Education (FARE): foodallergy.org 3. Mothers of Asthmatics: http://www.asthmacommunitynetwork.org 4. American College of Allergy, Asthma, and Immunology: www.acaai.org    Lifestyle Changes for Controlling GERD  When you have GERD, stomach acid feels as if it's backing up toward your mouth. Whether or not you take medication to control your GERD, your symptoms can  often be improved with lifestyle changes.   Raise Your Head  Reflux is more likely to strike when you're lying down flat, because stomach fluid can  flow backward more easily. Raising the head of your bed 4-6 inches can help. To do this:  Slide blocks or books under the legs at the head of your bed. Or, place a wedge under  the mattress. Many foam stores can make a suitable wedge for you. The wedge  should run from your waist to the top of your head.  Don't just prop your head on several pillows. This increases pressure on your  stomach. It can make GERD worse.  Watch Your Eating Habits Certain foods may increase the acid in your stomach or relax the lower esophageal sphincter, making GERD more likely. It's best to avoid the following:  Coffee, tea, and carbonated drinks (with and without caffeine)  Fatty, fried, or spicy food  Mint, chocolate, onions, and tomatoes  Any other foods that seem to irritate your stomach or cause you pain  Relieve the Pressure  Eat smaller meals, even if you have to eat more often.  Don't lie down right after you eat. Wait a few hours for your stomach to empty.  Avoid tight belts and tight-fitting clothes.  Lose excess weight.  Tobacco and Alcohol  Avoid smoking tobacco and drinking alcohol. They can make GERD symptoms worse.

## 2017-08-06 ENCOUNTER — Encounter: Payer: Self-pay | Admitting: Allergy & Immunology

## 2017-08-08 ENCOUNTER — Other Ambulatory Visit: Payer: Self-pay | Admitting: Allergy & Immunology

## 2017-08-08 DIAGNOSIS — T781XXD Other adverse food reactions, not elsewhere classified, subsequent encounter: Secondary | ICD-10-CM | POA: Diagnosis not present

## 2017-08-14 LAB — ALPHA-GAL PANEL
BEEF IGE: 1.07 kU/L — AB (ref <0.35)
CLASS: 2
Class: 1
GALACTOSE-ALPHA-1, 3-GALACTOSE IGE: 1.72 kU/L — AB (ref <0.10)
LAMB/MUTTON IGE: 0.29 kU/L (ref <0.35)
PORK IGE: 0.68 kU/L — AB (ref <0.35)

## 2017-08-15 ENCOUNTER — Telehealth: Payer: Self-pay | Admitting: Allergy & Immunology

## 2017-08-15 NOTE — Telephone Encounter (Signed)
I left a message for patient to call office.   Per Dr. Dellis AnesGallagher: "Could someone call [name] to let her know that alpha gal level has decreased and I would be willing to do a beef challenge in the office setting? Then and only then can we give her clearance to eat a hamburger on her own!"

## 2017-08-15 NOTE — Telephone Encounter (Signed)
Pt calling about labs °

## 2017-08-15 NOTE — Telephone Encounter (Signed)
Patient called back and I inform her of Dr. Ellouise NewerGallagher's suggestion and she will call back Monday and schedule that appointment.

## 2017-08-22 DIAGNOSIS — Z6837 Body mass index (BMI) 37.0-37.9, adult: Secondary | ICD-10-CM | POA: Diagnosis not present

## 2017-08-22 DIAGNOSIS — Z01419 Encounter for gynecological examination (general) (routine) without abnormal findings: Secondary | ICD-10-CM | POA: Diagnosis not present

## 2017-08-22 DIAGNOSIS — Z1231 Encounter for screening mammogram for malignant neoplasm of breast: Secondary | ICD-10-CM | POA: Diagnosis not present

## 2017-09-05 DIAGNOSIS — R7309 Other abnormal glucose: Secondary | ICD-10-CM | POA: Diagnosis not present

## 2017-09-05 DIAGNOSIS — E785 Hyperlipidemia, unspecified: Secondary | ICD-10-CM | POA: Diagnosis not present

## 2017-09-05 DIAGNOSIS — I10 Essential (primary) hypertension: Secondary | ICD-10-CM | POA: Diagnosis not present

## 2017-09-05 DIAGNOSIS — N390 Urinary tract infection, site not specified: Secondary | ICD-10-CM | POA: Diagnosis not present

## 2017-09-05 DIAGNOSIS — Z0000 Encounter for general adult medical examination without abnormal findings: Secondary | ICD-10-CM | POA: Diagnosis not present

## 2017-09-05 DIAGNOSIS — E559 Vitamin D deficiency, unspecified: Secondary | ICD-10-CM | POA: Diagnosis not present

## 2017-09-12 DIAGNOSIS — E039 Hypothyroidism, unspecified: Secondary | ICD-10-CM | POA: Diagnosis not present

## 2017-09-12 DIAGNOSIS — I10 Essential (primary) hypertension: Secondary | ICD-10-CM | POA: Diagnosis not present

## 2017-09-12 DIAGNOSIS — Z0000 Encounter for general adult medical examination without abnormal findings: Secondary | ICD-10-CM | POA: Diagnosis not present

## 2017-09-12 DIAGNOSIS — E785 Hyperlipidemia, unspecified: Secondary | ICD-10-CM | POA: Diagnosis not present

## 2017-09-24 ENCOUNTER — Encounter: Payer: Self-pay | Admitting: Family Medicine

## 2017-09-24 ENCOUNTER — Ambulatory Visit: Payer: BLUE CROSS/BLUE SHIELD | Admitting: Family Medicine

## 2017-09-24 VITALS — BP 108/72 | HR 80 | Resp 20

## 2017-09-24 DIAGNOSIS — T781XXD Other adverse food reactions, not elsewhere classified, subsequent encounter: Secondary | ICD-10-CM | POA: Diagnosis not present

## 2017-09-24 DIAGNOSIS — J4520 Mild intermittent asthma, uncomplicated: Secondary | ICD-10-CM | POA: Diagnosis not present

## 2017-09-24 DIAGNOSIS — J3089 Other allergic rhinitis: Secondary | ICD-10-CM

## 2017-09-24 DIAGNOSIS — T7800XD Anaphylactic reaction due to unspecified food, subsequent encounter: Secondary | ICD-10-CM

## 2017-09-24 MED ORDER — EPINEPHRINE 0.3 MG/0.3ML IJ SOAJ: 0.3000 mg | Freq: Once | INTRAMUSCULAR | 1 refills | Status: AC

## 2017-09-24 NOTE — Progress Notes (Signed)
[address] [place] [address] Dept: [phone]  FOLLOW UP NOTE  Patient ID: [name], female    DOB: Aug 08, 1965  Age: 52 y.o. MRN: [id number] Date of Office Visit: 09/24/2017  Assessment  Chief Complaint: food challange ([place] provided from home); Asthma (doing well); and Allergies ( some PND)  HPI [name] is a 52 year old female who presents for a open oral graded food challenge today. She was last seen in this clinic on 08/05/2017 by Dr. Dellis Anes for evaluation of asthma, food allergy, GERD, and allergic rhinitis. At that time, she was was started on Arnuity 200 mcg 1 puff twice a day for two weeks and then use for respiratory flares. after that. She was also started on ranitidine 150 once a day.   She reports she is feeling well this morning with no shortness of breath, cough, wheeze, or hives. She is not currently using Arnuity and continues to use her ProAir inhaler as needed. She has not taken antihistamines for the last 3 days.   Her current medications are listed in the chart.  Drug Allergies:  Allergies  Allergen Reactions  . Dilaudid [Hydromorphone Hcl]   . Septra [Sulfamethoxazole-Trimethoprim]   . Other Rash    ALPHA GAL RED MEATS    Physical Exam: BP 108/72   Pulse 80   Resp 20    Physical Exam  Constitutional: She is oriented to person, place, and time. She appears well-developed and well-nourished.  HENT:  Head: Normocephalic.  Right Ear: External ear normal.  Left Ear: External ear normal.  Nose: Nose normal.  Mouth/Throat: Oropharynx is clear and moist.  Eyes: Conjunctivae are normal.  Neck: Normal range of motion. Neck supple.  Cardiovascular: Normal rate, regular rhythm and normal heart sounds.  No murmur noted  Pulmonary/Chest: Effort normal and breath sounds normal.  Lungs clear to auscultation  Musculoskeletal: Normal range of motion.  Neurological: She is alert and oriented to person, place, and time.  Skin: Skin is  warm and dry.  Psychiatric: She has a normal mood and affect. Her behavior is normal. Judgment and thought content normal.    Diagnostics: FVC 3.70, FEV1 2.83. Predicted FVC 3.71, predicted FEV1 2.96. Spirometry is within the normal range.   Assessment and Plan: 1. Mild intermittent asthma without complication   2. Anaphylactic shock due to food, subsequent encounter   3. Perennial allergic rhinitis   4. Adverse food reaction, subsequent encounter     Meds ordered this encounter  Medications  . EPINEPHrine (AUVI-Q) 0.3 mg/0.3 mL IJ SOAJ injection    Sig: Inject 0.3 mLs (0.3 mg total) into the muscle once for 1 dose.    Dispense:  2 Device    Refill:  1   1. Adverse drug reaction - You somewhat tolerated the beef challenge today.  - We will send in a prescription for AuviQ - Be sure that you get this before going to eat beef.  - The patient was able to tolerate the challenge today with intermittent hives that appeared on her back as single hives and resolved with no medical intervention within 10 minutes. Vital signs were stable throughout the challenge and observation period. She received multiple doses separated by 20 minutes, each of which was separated by vitals and a brief physical exam. She received the following doses: 12 oz, 41 oz, and 47 oz. She was monitored for a total of 4 hours after her last dose. She did not experience, at any  time, cardiopulmonary or gastrointestinal symptoms.  Therefore, she has the same risk of systemic reaction associated with the consumption of beef as the general population.   2. Return in about 6 months (around 03/26/2018).    Return in about 6 months (around 03/26/2018), or if symptoms worsen or fail to improve.    Thank you for the opportunity to care for this patient.  Please do not hesitate to contact me with questions.  Thermon LeylandAnne , FNP [facility] [place]

## 2017-09-24 NOTE — Patient Instructions (Addendum)
1. Adverse drug reaction - You somewhat tolerated the beef challenge today.  - We will send in a prescription for AuviQ - Be sure that you get this before going to eat beef.   2. Return in about 6 months (around 03/26/2018).  Please inform us of any Emergency Department visits, hospitalizations, or changes in symptoms. Call us before going to the ED for breathing or allergy symptoms since we might be able to fit you in for a sick visit. Feel free to contact us anytime with any questions, problems, or concerns.  It was a pleasure to meet you today!   Websites that have reliable patient information: 1. American Academy of Asthma, Allergy, and Immunology: www.aaaai.org 2. Food Allergy Research and Education (FARE): foodallergy.org 3. Mothers of Asthmatics: http://www.asthmacommunitynetwork.org 4. American College of Allergy, Asthma, and Immunology: www.acaai.org

## 2017-09-30 DIAGNOSIS — Z8639 Personal history of other endocrine, nutritional and metabolic disease: Secondary | ICD-10-CM | POA: Diagnosis not present

## 2017-09-30 DIAGNOSIS — E039 Hypothyroidism, unspecified: Secondary | ICD-10-CM | POA: Diagnosis not present

## 2017-11-25 DIAGNOSIS — F4323 Adjustment disorder with mixed anxiety and depressed mood: Secondary | ICD-10-CM | POA: Diagnosis not present

## 2017-12-22 DIAGNOSIS — E559 Vitamin D deficiency, unspecified: Secondary | ICD-10-CM | POA: Diagnosis not present

## 2017-12-22 DIAGNOSIS — F418 Other specified anxiety disorders: Secondary | ICD-10-CM | POA: Diagnosis not present

## 2017-12-22 DIAGNOSIS — F439 Reaction to severe stress, unspecified: Secondary | ICD-10-CM | POA: Diagnosis not present

## 2017-12-22 DIAGNOSIS — E039 Hypothyroidism, unspecified: Secondary | ICD-10-CM | POA: Diagnosis not present

## 2017-12-30 DIAGNOSIS — F4323 Adjustment disorder with mixed anxiety and depressed mood: Secondary | ICD-10-CM | POA: Diagnosis not present

## 2018-01-08 DIAGNOSIS — F4323 Adjustment disorder with mixed anxiety and depressed mood: Secondary | ICD-10-CM | POA: Diagnosis not present

## 2018-02-03 ENCOUNTER — Ambulatory Visit: Payer: BLUE CROSS/BLUE SHIELD | Admitting: Allergy & Immunology

## 2018-02-24 ENCOUNTER — Telehealth: Payer: Self-pay | Admitting: Allergy & Immunology

## 2018-02-24 MED ORDER — RANITIDINE HCL 300 MG PO CAPS: 300.0000 mg | ORAL_CAPSULE | Freq: Every evening | ORAL | 5 refills | Status: DC

## 2018-02-24 NOTE — Telephone Encounter (Signed)
Pt called and said that she need a rx for prilosec.she try to sent receipt for prilosec and they told her her need a rx. [phone].

## 2018-02-24 NOTE — Addendum Note (Signed)
Addended by: Dub MikesHICKS, ASHLEY N on: 02/24/2018 05:56 PM   Modules accepted: Orders

## 2018-02-24 NOTE — Telephone Encounter (Signed)
I believe the Dexilant was too expensive. OK to send in a prescription for omeprazole 20mg  daily.   Please ask whether she is continuing to eat beef without a problem? Also remind her that it is [employer]! She is my foodie patient!   Thanks, Malachi BondsJoel Rubina Basinski, MD [facility] [place]

## 2018-02-24 NOTE — Telephone Encounter (Signed)
Patient was seen 07/2016 and Dr. Dellis AnesGallagher gave a sample of Dexilant 30mg  since she was having problems on Omeprazole. She returned on 01/2017 and a prescription was sent in for Dexilant 30 mg. Patient return 07/2017 and was prescribed Zantac 300mg . Please advise.

## 2018-02-24 NOTE — Telephone Encounter (Signed)
Called patient and she stated that she doesn't take omeprazole that she is taking zantac. She stated that in order for her to use her flex spending account she needs a written prescription. I have printed a prescription and will have Dr. Dellis AnesGallagher to sign it and patient will come pick it up in am.

## 2018-02-25 NOTE — Telephone Encounter (Signed)
Orders have been signed and placed up front for pick up. Patient made aware.

## 2018-03-10 ENCOUNTER — Ambulatory Visit: Payer: BLUE CROSS/BLUE SHIELD | Admitting: Allergy & Immunology

## 2018-03-17 DIAGNOSIS — Z8639 Personal history of other endocrine, nutritional and metabolic disease: Secondary | ICD-10-CM | POA: Diagnosis not present

## 2018-03-17 DIAGNOSIS — E039 Hypothyroidism, unspecified: Secondary | ICD-10-CM | POA: Diagnosis not present

## 2018-03-19 DIAGNOSIS — E039 Hypothyroidism, unspecified: Secondary | ICD-10-CM | POA: Diagnosis not present

## 2018-03-19 DIAGNOSIS — E559 Vitamin D deficiency, unspecified: Secondary | ICD-10-CM | POA: Diagnosis not present

## 2018-03-19 DIAGNOSIS — R7309 Other abnormal glucose: Secondary | ICD-10-CM | POA: Diagnosis not present

## 2018-03-19 DIAGNOSIS — I10 Essential (primary) hypertension: Secondary | ICD-10-CM | POA: Diagnosis not present

## 2018-03-19 DIAGNOSIS — N390 Urinary tract infection, site not specified: Secondary | ICD-10-CM | POA: Diagnosis not present

## 2018-03-26 DIAGNOSIS — F4323 Adjustment disorder with mixed anxiety and depressed mood: Secondary | ICD-10-CM | POA: Diagnosis not present

## 2018-04-09 ENCOUNTER — Encounter: Payer: Self-pay | Admitting: Allergy & Immunology

## 2018-04-09 ENCOUNTER — Ambulatory Visit: Payer: BLUE CROSS/BLUE SHIELD | Admitting: Allergy & Immunology

## 2018-04-09 VITALS — BP 120/68 | HR 88 | Resp 18 | Ht 64.6 in | Wt 255.2 lb

## 2018-04-09 DIAGNOSIS — K219 Gastro-esophageal reflux disease without esophagitis: Secondary | ICD-10-CM

## 2018-04-09 DIAGNOSIS — J4520 Mild intermittent asthma, uncomplicated: Secondary | ICD-10-CM | POA: Diagnosis not present

## 2018-04-09 DIAGNOSIS — J3089 Other allergic rhinitis: Secondary | ICD-10-CM

## 2018-04-09 MED ORDER — FAMOTIDINE 20 MG PO TABS: 20.0000 mg | ORAL_TABLET | Freq: Two times a day (BID) | ORAL | 5 refills | Status: DC

## 2018-04-09 MED ORDER — FAMOTIDINE 20 MG PO TABS: 20.0000 mg | ORAL_TABLET | Freq: Two times a day (BID) | ORAL | 5 refills | Status: AC

## 2018-04-09 NOTE — Patient Instructions (Addendum)
1. Mild intermittent asthma, uncomplicated - Your lung function was normal today.  - We will remove this from your Problem List.   2. GERD - Replace the Zantac with famotidine 20mg  twice daily.   3. Allergic rhinitis (horses, mold, dog) - Continue with nasal saline rinses twice daily.   4. Return in about 1 year (around 04/10/2019).  Please inform us of any Emergency Department visits, hospitalizations, or changes in symptoms. Call us before going to the ED for breathing or allergy symptoms since we might be able to fit you in for a sick visit. Feel free to contact us anytime with any questions, problems, or concerns.  It was a pleasure to see you again today!   Websites that have reliable patient information: 1. American Academy of Asthma, Allergy, and Immunology: www.aaaai.org 2. Food Allergy Research and Education (FARE): foodallergy.org 3. Mothers of Asthmatics: http://www.asthmacommunitynetwork.org 4. American College of Allergy, Asthma, and Immunology: www.acaai.org    Lifestyle Changes for Controlling GERD  When you have GERD, stomach acid feels as if it's backing up toward your mouth. Whether or not you take medication to control your GERD, your symptoms can often be improved with lifestyle changes.   Raise Your Head  Reflux is more likely to strike when you're lying down flat, because stomach fluid can  flow backward more easily. Raising the head of your bed 4-6 inches can help. To do this:  Slide blocks or books under the legs at the head of your bed. Or, place a wedge under  the mattress. Many foam stores can make a suitable wedge for you. The wedge  should run from your waist to the top of your head.  Don't just prop your head on several pillows. This increases pressure on your  stomach. It can make GERD worse.  Watch Your Eating Habits Certain foods may increase the acid in your stomach or relax the lower esophageal sphincter, making GERD more likely. It's  best to avoid the following:  Coffee, tea, and carbonated drinks (with and without caffeine)  Fatty, fried, or spicy food  Mint, chocolate, onions, and tomatoes  Any other foods that seem to irritate your stomach or cause you pain  Relieve the Pressure  Eat smaller meals, even if you have to eat more often.  Don't lie down right after you eat. Wait a few hours for your stomach to empty.  Avoid tight belts and tight-fitting clothes.  Lose excess weight.  Tobacco and Alcohol  Avoid smoking tobacco and drinking alcohol. They can make GERD symptoms worse.

## 2018-04-09 NOTE — Progress Notes (Signed)
FOLLOW UP  Date of Service/Encounter:  04/09/18   Assessment:   Mild intermittent asthma, uncomplicated  Gastroesophageal reflux disease  Allergic rhinitis (horses, mold, dog)  Plan/Recommendations:   1. Mild intermittent asthma, uncomplicated - Your lung function was normal today.  - We will remove this from your Problem List.  - She reports that she has needed any of the Arnuity and has not needed the albuterol in years.  - She thinks that this was first reported when she presented with a terrible sinus infection.   2. GERD - Replace the Zantac with famotidine 20mg  twice daily.  - She is hesitant to do any of the omeprazole due to the concern for bone mineralization.   3. Allergic rhinitis (horses, mold, dog) - Continue with nasal saline rinses twice daily.  - Symptoms overall are not a hindrance to her.   4. Return in about 1 year (around 04/10/2019).  Subjective:   [name] is a 52 y.o. female presenting today for follow up of  Chief Complaint  Patient presents with  . Follow-up  . Gastroesophageal Reflux    [name] has a history of the following: Patient Active Problem List   Diagnosis Date Noted  . Mild intermittent asthma without complication 01/28/2017  . Gastroesophageal reflux disease 01/28/2017  . Adverse food reaction 01/28/2017  . 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 Perennial allergic rhinitis 08/28/2015    History obtained from: chart review and patient.  [name] Primary Care Provider is Thayer Headings, MD.     [name] is a 51 y.o. female presenting for a follow up visit.  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  She passed this with flying colors.  She has a history of asthma as well as reflux and allergic rhinitis.  She has Arnuity which she only uses during respiratory illnesses.  Since the last visit, [name] has done very well. She has been doing well  with ingestion of beef. In fact, she just purchased half of a cow which should feed her for more than one year. She has had no reactions to the beef and she has avoided additional tick bites. She is very talkative today, as usual.  Her breathing has not been an issue at all. She has not needed her Arnuity in quite some time and has not had any problems with SOB or need for albuterol. ACT today is 25, indicating excellent asthma control.  GERD has been an issue. She knows that she eats all of the things that make her symptoms worse. She does not use anything on a regular basis. Although she does have omeprazole, she does not like to take it every day. She was on ranitidine but this has not been changed since information came out that a cancer causing ingredient was in this medication.   Otherwise, there have been no changes to her past medical history, surgical history, family history, or social history. 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     Review of Systems: a 14-point review of systems is pertinent for what is mentioned in HPI.  Otherwise, all other systems were negative.  Constitutional: negative other than that listed in the HPI Eyes: negative other than that listed in the HPI Ears, nose, mouth, throat, and face: negative other than that listed in the HPI Respiratory: negative  other than that listed in the HPI Cardiovascular: negative other than that listed in the HPI Gastrointestinal: negative other than that listed in the HPI Genitourinary: negative other than that listed in the HPI Integument: negative other than that listed in the HPI Hematologic: negative other than that listed in the HPI Musculoskeletal: negative other than that listed in the HPI Neurological: negative other than that listed in the HPI Allergy/Immunologic: negative other than that listed in the HPI    Objective:   Blood pressure 120/68, pulse 88, resp.  rate 18, height 5' 4.6" (1.641 m), weight 255 lb 3.2 oz (115.8 kg), SpO2 98 %. Body mass index is 43 kg/m.   Physical Exam:  General: Alert, interactive, in no acute distress. Pleasant female. Very talkative.  Eyes: No conjunctival injection bilaterally, no discharge on the right, no discharge on the left and no Horner-Trantas dots present. PERRL bilaterally. EOMI without pain. No photophobia.  Ears: Right TM pearly gray with normal light reflex, Left TM pearly gray with normal light reflex, Right TM intact without perforation and Left TM intact without perforation.  Nose/Throat: External nose within normal limits and septum midline. Turbinates edematous and pale with clear discharge. Posterior oropharynx erythematous without cobblestoning in the posterior oropharynx. Tonsils 2+ without exudates.  Tongue without thrush. Lungs: Clear to auscultation without wheezing, rhonchi or rales. No increased work of breathing. CV: Normal S1/S2. No murmurs. Capillary refill <2 seconds.  Skin: Warm and dry, without lesions or rashes. Neuro:   Grossly intact. No focal deficits appreciated. Responsive to questions.  Diagnostic studies:   Spirometry: results normal (FEV1: 3.06/93%, FVC: 3.62/93%, FEV1/FVC: 85%).    Spirometry consistent with normal pattern.   Allergy Studies: none       Malachi Bonds, MD  [facility]

## 2018-04-16 ENCOUNTER — Telehealth: Payer: Self-pay | Admitting: *Deleted

## 2018-04-16 NOTE — Telephone Encounter (Signed)
Patient's insurance will not cover Pepcid 20mg . Called patient and advised that they will need to purchase OTC. Patient verbalized understanding.

## 2018-07-08 DIAGNOSIS — M9905 Segmental and somatic dysfunction of pelvic region: Secondary | ICD-10-CM | POA: Diagnosis not present

## 2018-07-08 DIAGNOSIS — M9903 Segmental and somatic dysfunction of lumbar region: Secondary | ICD-10-CM | POA: Diagnosis not present

## 2018-07-08 DIAGNOSIS — M9902 Segmental and somatic dysfunction of thoracic region: Secondary | ICD-10-CM | POA: Diagnosis not present

## 2018-07-08 DIAGNOSIS — M9904 Segmental and somatic dysfunction of sacral region: Secondary | ICD-10-CM | POA: Diagnosis not present

## 2018-07-13 DIAGNOSIS — M9905 Segmental and somatic dysfunction of pelvic region: Secondary | ICD-10-CM | POA: Diagnosis not present

## 2018-07-13 DIAGNOSIS — M9906 Segmental and somatic dysfunction of lower extremity: Secondary | ICD-10-CM | POA: Diagnosis not present

## 2018-07-13 DIAGNOSIS — M9903 Segmental and somatic dysfunction of lumbar region: Secondary | ICD-10-CM | POA: Diagnosis not present

## 2018-07-13 DIAGNOSIS — M9904 Segmental and somatic dysfunction of sacral region: Secondary | ICD-10-CM | POA: Diagnosis not present

## 2018-07-15 DIAGNOSIS — M9906 Segmental and somatic dysfunction of lower extremity: Secondary | ICD-10-CM | POA: Diagnosis not present

## 2018-07-15 DIAGNOSIS — M9904 Segmental and somatic dysfunction of sacral region: Secondary | ICD-10-CM | POA: Diagnosis not present

## 2018-07-15 DIAGNOSIS — M9905 Segmental and somatic dysfunction of pelvic region: Secondary | ICD-10-CM | POA: Diagnosis not present

## 2018-07-15 DIAGNOSIS — M9903 Segmental and somatic dysfunction of lumbar region: Secondary | ICD-10-CM | POA: Diagnosis not present

## 2018-07-21 DIAGNOSIS — M9903 Segmental and somatic dysfunction of lumbar region: Secondary | ICD-10-CM | POA: Diagnosis not present

## 2018-07-21 DIAGNOSIS — M9904 Segmental and somatic dysfunction of sacral region: Secondary | ICD-10-CM | POA: Diagnosis not present

## 2018-07-21 DIAGNOSIS — M9905 Segmental and somatic dysfunction of pelvic region: Secondary | ICD-10-CM | POA: Diagnosis not present

## 2018-07-21 DIAGNOSIS — M9906 Segmental and somatic dysfunction of lower extremity: Secondary | ICD-10-CM | POA: Diagnosis not present

## 2018-07-23 DIAGNOSIS — M9903 Segmental and somatic dysfunction of lumbar region: Secondary | ICD-10-CM | POA: Diagnosis not present

## 2018-07-23 DIAGNOSIS — M9905 Segmental and somatic dysfunction of pelvic region: Secondary | ICD-10-CM | POA: Diagnosis not present

## 2018-07-23 DIAGNOSIS — M9904 Segmental and somatic dysfunction of sacral region: Secondary | ICD-10-CM | POA: Diagnosis not present

## 2018-07-23 DIAGNOSIS — M9906 Segmental and somatic dysfunction of lower extremity: Secondary | ICD-10-CM | POA: Diagnosis not present

## 2018-08-06 DIAGNOSIS — M9903 Segmental and somatic dysfunction of lumbar region: Secondary | ICD-10-CM | POA: Diagnosis not present

## 2018-08-06 DIAGNOSIS — M9906 Segmental and somatic dysfunction of lower extremity: Secondary | ICD-10-CM | POA: Diagnosis not present

## 2018-08-06 DIAGNOSIS — M9904 Segmental and somatic dysfunction of sacral region: Secondary | ICD-10-CM | POA: Diagnosis not present

## 2018-08-06 DIAGNOSIS — M9905 Segmental and somatic dysfunction of pelvic region: Secondary | ICD-10-CM | POA: Diagnosis not present

## 2018-08-10 ENCOUNTER — Telehealth: Payer: Self-pay | Admitting: Allergy & Immunology

## 2018-08-10 NOTE — Telephone Encounter (Signed)
Called patient states she is complaining of cough, shortness of breathe since 08/01/2018 denies fever currently had fever the 1st and denies traveling appt made to see Dr Dellis Anes on 08/11/2018 @ 10am patient agrees

## 2018-08-10 NOTE — Telephone Encounter (Signed)
Patient is calling still complaining of cough, wheezing, and SOB Patient states she only has one inhaler and it doesn't have a steroid in it What can she do?? Please call to answer any questions

## 2018-08-11 ENCOUNTER — Other Ambulatory Visit: Payer: Self-pay

## 2018-08-11 ENCOUNTER — Encounter: Payer: Self-pay | Admitting: Allergy & Immunology

## 2018-08-11 ENCOUNTER — Ambulatory Visit: Payer: BLUE CROSS/BLUE SHIELD | Admitting: Allergy & Immunology

## 2018-08-11 VITALS — BP 132/80 | HR 86 | Temp 97.8°F | Resp 18 | Ht 64.5 in | Wt 258.0 lb

## 2018-08-11 DIAGNOSIS — J0100 Acute maxillary sinusitis, unspecified: Secondary | ICD-10-CM

## 2018-08-11 DIAGNOSIS — J4521 Mild intermittent asthma with (acute) exacerbation: Secondary | ICD-10-CM | POA: Diagnosis not present

## 2018-08-11 DIAGNOSIS — J3089 Other allergic rhinitis: Secondary | ICD-10-CM

## 2018-08-11 DIAGNOSIS — K219 Gastro-esophageal reflux disease without esophagitis: Secondary | ICD-10-CM

## 2018-08-11 MED ORDER — PREDNISONE 10 MG PO TABS: ORAL_TABLET | ORAL | 0 refills | Status: DC

## 2018-08-11 MED ORDER — FLUTICASONE FUROATE 200 MCG/ACT IN AEPB: 1.0000 | INHALATION_SPRAY | Freq: Every day | RESPIRATORY_TRACT | 3 refills | Status: AC

## 2018-08-11 NOTE — Patient Instructions (Addendum)
1. Mild intermittent asthma - with acute exacerbation  - Your lung function was normal today but it did improve with the albuterol nebulizer. - We are going to start you on a prednisone burst: Take 3 tabs (30mg ) twice daily for 3 days, then 2 tabs (20mg ) twice daily for 3 days, then 1 tab (10mg ) twice daily for 3 days, then STOP. - Restart the Arnuity one puff once daily and continue for two weeks - Continue with the albuterol four puffs every 4-6 hours as needed.   2. GERD  - Continue famotidine 20mg  twice daily.   3. Allergic rhinitis (horses, mold, dog) - Continue with nasal saline rinses twice daily.    4. Return in about 6 months (around 02/11/2019).   Please inform us of any Emergency Department visits, hospitalizations, or changes in symptoms. Call us before going to the ED for breathing or allergy symptoms since we might be able to fit you in for a sick visit. Feel free to contact us anytime with any questions, problems, or concerns.  It was a pleasure to see you again today!  Websites that have reliable patient information: 1. American Academy of Asthma, Allergy, and Immunology: www.aaaai.org 2. Food Allergy Research and Education (FARE): foodallergy.org 3. Mothers of Asthmatics: http://www.asthmacommunitynetwork.org 4. American College of Allergy, Asthma, and Immunology: MissingWeapons.ca   Make sure you are registered to vote! If you have moved or changed any of your contact information, you will need to get this updated before voting!    Voter ID laws are NOT going into effect for the General Election in November 2020! DO NOT let this stop you from exercising your right to vote!

## 2018-08-11 NOTE — Progress Notes (Signed)
FOLLOW UP  Date of Service/Encounter:  08/11/18   Assessment:   Mild intermittent asthma, uncomplicated  Gastroesophageal reflux disease  Allergic rhinitis (horses, mold, dog)  Plan/Recommendations:   1. Mild intermittent asthma - with acute exacerbation  - Your lung function was normal today but it did improve with the albuterol nebulizer. - We are going to start you on a prednisone burst: Take 3 tabs (30mg ) twice daily for 3 days, then 2 tabs (20mg ) twice daily for 3 days, then 1 tab (10mg ) twice daily for 3 days, then STOP. - Restart the Arnuity one puff once daily and continue for two weeks - Continue with the albuterol four puffs every 4-6 hours as needed.   2. GERD  - Continue famotidine 20mg  twice daily.   3. Allergic rhinitis (horses, mold, dog) - Continue with nasal saline rinses twice daily.    4. Return in about 6 months (around 02/11/2019).   Subjective:   [name] is a 53 y.o. female presenting today for follow up of  Chief Complaint  Patient presents with  . Asthma    [name] has a history of the following: Patient Active Problem List   Diagnosis Date Noted  . Gastroesophageal reflux disease 01/28/2017  . Adverse food reaction 01/28/2017  . 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 Perennial allergic rhinitis 08/28/2015    History obtained from: chart review and patient.  [name] is a 53 y.o. female presenting for a sick visit. She was last seen in October 2019. At that time, she was doing well from an asthma perspective. In fact, we actually removed it from her Problem List completely.  We replaced her Zantac with famotidine to 20 mg twice daily.  For her allergic rhinitis, we continue nasal saline rinses twice daily.  Her symptoms have never been overly severe.  She also continues to tolerate red meat without a problem.  In the interim, she did develop an illness on Saturday, February 22.  She has had a sore throat, fever,  chills, and shortness of breath.  Over the weekend, her fever broke.  We did talk to her last week and recommended that she use her inhaled steroid.  We also recommended using albuterol 4 puffs every 4-6 hours.  We asked her to call us back if there is no improvement.  Since last week, she has continued to have problems.  She is very concerned with her shortness of breath and wheezing.  She does use the albuterol with improvement in her symptoms.  She has restarted her Arnuity.  She denies sinus pressure, but she does have quite a bit of mucus production.  She has not needed prednisone in several years.  She does need a refill on her Arnuity, which is actually expired.  She would continues to use her sample from 1 of her visits.  Otherwise, there have been no changes to her past medical history, surgical history, family history, or social history.    Review of Systems  Constitutional: Negative.  Negative for fever, malaise/fatigue and weight loss.  HENT: Negative.  Negative for congestion, ear discharge and ear pain.   Eyes: Negative for pain, discharge and redness.  Respiratory: Positive for cough, sputum production, shortness of breath and wheezing.   Cardiovascular: Negative.  Negative for chest pain and palpitations.  Gastrointestinal: Negative for abdominal pain and heartburn.  Skin: Negative.  Negative for itching and rash.  Neurological: Negative for dizziness and headaches.  Endo/Heme/Allergies:  Negative for environmental allergies. Does not bruise/bleed easily.       Objective:   Blood pressure 132/80, pulse 86, temperature 97.8 F (36.6 C), temperature source Oral, resp. rate 18, height 5' 4.5" (1.638 m), weight 258 lb (117 kg), SpO2 96 %. Body mass index is 43.6 kg/m.   Physical Exam:  Physical Exam  Constitutional: She appears well-developed.  HENT:  Head: Normocephalic and atraumatic.  Right Ear: Tympanic membrane, external ear and ear canal normal.  Left Ear:  Tympanic membrane and ear canal normal.  Nose: Mucosal edema and rhinorrhea present. No nasal deformity or septal deviation. No epistaxis. Right sinus exhibits no maxillary sinus tenderness and no frontal sinus tenderness. Left sinus exhibits no maxillary sinus tenderness and no frontal sinus tenderness.  Mouth/Throat: Uvula is midline and oropharynx is clear and moist. Mucous membranes are not pale and not dry.  Mucopurulent discharge within the right nasal cavity. Left nasal cavity appears fairly clear with scant clear discharge.  Eyes: Pupils are equal, round, and reactive to light. Conjunctivae and EOM are normal. Right eye exhibits no chemosis and no discharge. Left eye exhibits no chemosis and no discharge. Right conjunctiva is not injected. Left conjunctiva is not injected.  Cardiovascular: Normal rate, regular rhythm and normal heart sounds.  Respiratory: Effort normal. No accessory muscle usage. No tachypnea. No respiratory distress. She has decreased breath sounds in the right lower field. She has no wheezes. She has no rhonchi. She has no rales. She exhibits no tenderness.  There is decreased air movement at the bases, but no overt wheezing appreciated.  She does have some coarse upper airway noise throughout.  There are no crackles or rales.  Lymphadenopathy:    She has no cervical adenopathy.  Neurological: She is alert.  Skin: No abrasion, no petechiae and no rash noted. Rash is not papular, not vesicular and not urticarial. No erythema. No pallor.  Psychiatric: She has a normal mood and affect.     Diagnostic studies:    Spirometry: results normal (FEV1: 2.68/95%, FVC: 2.92/82%, FEV1/FVC: 92%).    Spirometry consistent with normal pattern.   Allergy Studies: none     Malachi Bonds, MD  [facility]

## 2018-08-14 ENCOUNTER — Ambulatory Visit (HOSPITAL_COMMUNITY)
Admission: RE | Admit: 2018-08-14 | Discharge: 2018-08-14 | Disposition: A | Discharge status: Home / Self Care | Payer: BLUE CROSS/BLUE SHIELD | Source: Ambulatory Visit | Attending: Family Medicine | Admitting: Family Medicine

## 2018-08-14 ENCOUNTER — Other Ambulatory Visit (HOSPITAL_COMMUNITY): Payer: Self-pay | Admitting: Family Medicine

## 2018-08-14 ENCOUNTER — Other Ambulatory Visit: Payer: Self-pay

## 2018-08-14 ENCOUNTER — Inpatient Hospital Stay (HOSPITAL_COMMUNITY)
Admission: EM | Admit: 2018-08-14 | Discharge: 2018-08-17 | DRG: 175 | Disposition: A | Discharge status: Home / Self Care | Payer: BLUE CROSS/BLUE SHIELD | Attending: Internal Medicine | Admitting: Internal Medicine

## 2018-08-14 ENCOUNTER — Encounter (HOSPITAL_COMMUNITY): Payer: Self-pay

## 2018-08-14 ENCOUNTER — Other Ambulatory Visit: Payer: Self-pay | Admitting: Family Medicine

## 2018-08-14 DIAGNOSIS — J309 Allergic rhinitis, unspecified: Secondary | ICD-10-CM | POA: Diagnosis not present

## 2018-08-14 DIAGNOSIS — J9601 Acute respiratory failure with hypoxia: Secondary | ICD-10-CM | POA: Diagnosis present

## 2018-08-14 DIAGNOSIS — E669 Obesity, unspecified: Secondary | ICD-10-CM | POA: Diagnosis present

## 2018-08-14 DIAGNOSIS — Z8709 Personal history of other diseases of the respiratory system: Secondary | ICD-10-CM | POA: Diagnosis not present

## 2018-08-14 DIAGNOSIS — Z7951 Long term (current) use of inhaled steroids: Secondary | ICD-10-CM | POA: Diagnosis not present

## 2018-08-14 DIAGNOSIS — I2602 Saddle embolus of pulmonary artery with acute cor pulmonale: Secondary | ICD-10-CM | POA: Diagnosis not present

## 2018-08-14 DIAGNOSIS — I5032 Chronic diastolic (congestive) heart failure: Secondary | ICD-10-CM | POA: Diagnosis not present

## 2018-08-14 DIAGNOSIS — K219 Gastro-esophageal reflux disease without esophagitis: Secondary | ICD-10-CM | POA: Diagnosis present

## 2018-08-14 DIAGNOSIS — Z888 Allergy status to other drugs, medicaments and biological substances status: Secondary | ICD-10-CM

## 2018-08-14 DIAGNOSIS — R079 Chest pain, unspecified: Secondary | ICD-10-CM

## 2018-08-14 DIAGNOSIS — I10 Essential (primary) hypertension: Secondary | ICD-10-CM | POA: Diagnosis not present

## 2018-08-14 DIAGNOSIS — R7989 Other specified abnormal findings of blood chemistry: Secondary | ICD-10-CM | POA: Diagnosis not present

## 2018-08-14 DIAGNOSIS — E039 Hypothyroidism, unspecified: Secondary | ICD-10-CM | POA: Diagnosis present

## 2018-08-14 DIAGNOSIS — I2699 Other pulmonary embolism without acute cor pulmonale: Secondary | ICD-10-CM | POA: Diagnosis present

## 2018-08-14 DIAGNOSIS — K760 Fatty (change of) liver, not elsewhere classified: Secondary | ICD-10-CM | POA: Diagnosis not present

## 2018-08-14 DIAGNOSIS — M7989 Other specified soft tissue disorders: Secondary | ICD-10-CM | POA: Diagnosis not present

## 2018-08-14 DIAGNOSIS — N2889 Other specified disorders of kidney and ureter: Secondary | ICD-10-CM | POA: Diagnosis present

## 2018-08-14 DIAGNOSIS — E892 Postprocedural hypoparathyroidism: Secondary | ICD-10-CM | POA: Diagnosis present

## 2018-08-14 DIAGNOSIS — Z793 Long term (current) use of hormonal contraceptives: Secondary | ICD-10-CM

## 2018-08-14 DIAGNOSIS — R0602 Shortness of breath: Secondary | ICD-10-CM | POA: Insufficient documentation

## 2018-08-14 DIAGNOSIS — Z885 Allergy status to narcotic agent status: Secondary | ICD-10-CM | POA: Diagnosis not present

## 2018-08-14 DIAGNOSIS — R52 Pain, unspecified: Secondary | ICD-10-CM | POA: Diagnosis not present

## 2018-08-14 DIAGNOSIS — M79669 Pain in unspecified lower leg: Secondary | ICD-10-CM | POA: Insufficient documentation

## 2018-08-14 DIAGNOSIS — Z79899 Other long term (current) drug therapy: Secondary | ICD-10-CM

## 2018-08-14 DIAGNOSIS — I82431 Acute embolism and thrombosis of right popliteal vein: Secondary | ICD-10-CM | POA: Diagnosis present

## 2018-08-14 DIAGNOSIS — Z79890 Hormone replacement therapy (postmenopausal): Secondary | ICD-10-CM

## 2018-08-14 DIAGNOSIS — Z6841 Body Mass Index (BMI) 40.0 and over, adult: Secondary | ICD-10-CM

## 2018-08-14 DIAGNOSIS — R000 Tachycardia, unspecified: Secondary | ICD-10-CM | POA: Diagnosis not present

## 2018-08-14 LAB — BASIC METABOLIC PANEL
Anion gap: 14 (ref 5–15)
BUN: 15 mg/dL (ref 6–20)
CO2: 22 mmol/L (ref 22–32)
Calcium: 8.2 mg/dL — ABNORMAL LOW (ref 8.9–10.3)
Chloride: 101 mmol/L (ref 98–111)
Creatinine, Ser: 1.1 mg/dL — ABNORMAL HIGH (ref 0.44–1.00)
GFR calc Af Amer: >60 mL/min (ref >60)
GFR calc non Af Amer: 58 mL/min — ABNORMAL LOW (ref >60)
Glucose, Bld: 153 mg/dL — ABNORMAL HIGH (ref 70–99)
POTASSIUM: 3.7 mmol/L (ref 3.5–5.1)
Sodium: 137 mmol/L (ref 135–145)

## 2018-08-14 LAB — CBC WITH DIFFERENTIAL/PLATELET
Abs Immature Granulocytes: 0.08 10*3/uL — ABNORMAL HIGH (ref 0.00–0.07)
BASOS ABS: 0 10*3/uL (ref 0.0–0.1)
Basophils Relative: 0 %
Eosinophils Absolute: 0 10*3/uL (ref 0.0–0.5)
Eosinophils Relative: 0 %
HCT: 43.9 % (ref 36.0–46.0)
Hemoglobin: 15.1 g/dL — ABNORMAL HIGH (ref 12.0–15.0)
Immature Granulocytes: 1 %
Lymphocytes Relative: 24 %
Lymphs Abs: 4.1 10*3/uL — ABNORMAL HIGH (ref 0.7–4.0)
MCH: 29.8 pg (ref 26.0–34.0)
MCHC: 34.4 g/dL (ref 30.0–36.0)
MCV: 86.8 fL (ref 80.0–100.0)
Monocytes Absolute: 0.7 10*3/uL (ref 0.1–1.0)
Monocytes Relative: 4 %
Neutro Abs: 11.8 10*3/uL — ABNORMAL HIGH (ref 1.7–7.7)
Neutrophils Relative %: 71 %
Platelets: 226 10*3/uL (ref 150–400)
RBC: 5.06 MIL/uL (ref 3.87–5.11)
RDW: 12.5 % (ref 11.5–15.5)
WBC: 16.7 10*3/uL — ABNORMAL HIGH (ref 4.0–10.5)
nRBC: 0 % (ref 0.0–0.2)

## 2018-08-14 LAB — HEPARIN LEVEL (UNFRACTIONATED): Heparin Unfractionated: 0.45 [IU]/mL (ref 0.30–0.70)

## 2018-08-14 LAB — POCT I-STAT CREATININE: Creatinine, Ser: 1 mg/dL (ref 0.44–1.00)

## 2018-08-14 LAB — ANTITHROMBIN III: AntiThromb III Func: 97 % (ref 75–120)

## 2018-08-14 LAB — PROTIME-INR
INR: 1.1 (ref 0.8–1.2)
Prothrombin Time: 14.2 s (ref 11.4–15.2)

## 2018-08-14 LAB — TROPONIN I: Troponin I: 0.27 ng/mL (ref <0.03)

## 2018-08-14 MED ORDER — HEPARIN (PORCINE) 25000 UT/250ML-% IV SOLN
1700.0000 [IU]/h | INTRAVENOUS | Status: DC
  Administered 2018-08-14: 1500 [IU]/h via INTRAVENOUS
  Administered 2018-08-15: 1700 [IU]/h via INTRAVENOUS
  Filled 2018-08-14 (×3): qty 250

## 2018-08-14 MED ORDER — ALBUTEROL SULFATE (2.5 MG/3ML) 0.083% IN NEBU: 3.0000 mL | INHALATION_SOLUTION | RESPIRATORY_TRACT | Status: DC | PRN

## 2018-08-14 MED ORDER — FLUTICASONE FUROATE 200 MCG/ACT IN AEPB: 1.0000 | INHALATION_SPRAY | Freq: Every day | RESPIRATORY_TRACT | Status: DC

## 2018-08-14 MED ORDER — ACETAMINOPHEN 325 MG PO TABS
650.0000 mg | ORAL_TABLET | Freq: Four times a day (QID) | ORAL | Status: DC | PRN
  Administered 2018-08-16: 650 mg via ORAL
  Filled 2018-08-14: qty 2

## 2018-08-14 MED ORDER — LISINOPRIL-HYDROCHLOROTHIAZIDE 10-12.5 MG PO TABS: 1.0000 | ORAL_TABLET | Freq: Every day | ORAL | Status: DC

## 2018-08-14 MED ORDER — ONDANSETRON HCL 4 MG PO TABS: 4.0000 mg | ORAL_TABLET | Freq: Four times a day (QID) | ORAL | Status: DC | PRN

## 2018-08-14 MED ORDER — SODIUM CHLORIDE 0.9% FLUSH
3.0000 mL | Freq: Two times a day (BID) | INTRAVENOUS | Status: DC
  Administered 2018-08-15 – 2018-08-16 (×2): 3 mL via INTRAVENOUS

## 2018-08-14 MED ORDER — FAMOTIDINE 20 MG PO TABS
20.0000 mg | ORAL_TABLET | Freq: Two times a day (BID) | ORAL | Status: DC
  Administered 2018-08-14 – 2018-08-17 (×6): 20 mg via ORAL
  Filled 2018-08-14 (×6): qty 1

## 2018-08-14 MED ORDER — HYDROCHLOROTHIAZIDE 12.5 MG PO CAPS
12.5000 mg | ORAL_CAPSULE | Freq: Every day | ORAL | Status: DC
  Administered 2018-08-15: 12.5 mg via ORAL
  Filled 2018-08-14: qty 1

## 2018-08-14 MED ORDER — DIPHENHYDRAMINE HCL 25 MG PO CAPS: 25.0000 mg | ORAL_CAPSULE | Freq: Every day | ORAL | Status: DC | PRN

## 2018-08-14 MED ORDER — HEPARIN BOLUS VIA INFUSION
6700.0000 [IU] | Freq: Once | INTRAVENOUS | Status: AC
  Administered 2018-08-14: 6700 [IU] via INTRAVENOUS
  Filled 2018-08-14: qty 6700

## 2018-08-14 MED ORDER — ONDANSETRON HCL 4 MG/2ML IJ SOLN
4.0000 mg | Freq: Four times a day (QID) | INTRAMUSCULAR | Status: DC | PRN
  Administered 2018-08-15: 4 mg via INTRAVENOUS
  Filled 2018-08-14: qty 2

## 2018-08-14 MED ORDER — ACETAMINOPHEN 650 MG RE SUPP: 650.0000 mg | Freq: Four times a day (QID) | RECTAL | Status: DC | PRN

## 2018-08-14 MED ORDER — LISINOPRIL 10 MG PO TABS
10.0000 mg | ORAL_TABLET | Freq: Every day | ORAL | Status: DC
  Administered 2018-08-15: 10 mg via ORAL
  Filled 2018-08-14: qty 1

## 2018-08-14 MED ORDER — BUDESONIDE 0.5 MG/2ML IN SUSP
0.5000 mg | Freq: Two times a day (BID) | RESPIRATORY_TRACT | Status: DC
  Administered 2018-08-14 – 2018-08-17 (×6): 0.5 mg via RESPIRATORY_TRACT
  Filled 2018-08-14 (×6): qty 2

## 2018-08-14 MED ORDER — LEVOTHYROXINE SODIUM 75 MCG PO TABS
150.0000 ug | ORAL_TABLET | Freq: Every day | ORAL | Status: DC
  Administered 2018-08-15 – 2018-08-17 (×3): 150 ug via ORAL
  Filled 2018-08-14 (×3): qty 2

## 2018-08-14 MED ORDER — IOHEXOL 350 MG/ML SOLN
75.0000 mL | Freq: Once | INTRAVENOUS | Status: AC | PRN
  Administered 2018-08-14: 75 mL via INTRAVENOUS

## 2018-08-14 NOTE — Progress Notes (Signed)
ANTICOAGULATION CONSULT NOTE  Pharmacy Consult for heparin Indication: pulmonary embolus  Heparin Dosing Weight: 84 kg  Labs: Recent Labs    08/14/18 1251  CREATININE 1.00    Assessment: 52 yof presenting with acute, marked bilateral PE with RHS (RV/LV ratio = 2.16). Pharmacy consulted to dose heparin. CBC pending. No active bleed issues documented. Not on anticoagulation PTA.  Goal of Therapy:  Heparin level 0.3-0.7 units/ml Monitor platelets by anticoagulation protocol: Yes   Plan:  Heparin 6700 unit bolus Start heparin at 1500 units/h 6h heparin level Daily heparin level/CBC Monitor s/sx bleeding  Babs Bertin, PharmD, BCPS Clinical Pharmacist 08/14/2018 2:18 PM

## 2018-08-14 NOTE — Progress Notes (Signed)
Pt admitted to 5 west room 3. A&O x4. VS stable. Tele monitor placed & verified. All questions/concerns addressed. Call bell within reach. Will continue to monitor.

## 2018-08-14 NOTE — H&P (Signed)
History and Physical    [name]:[id number] DOB: 12/19/1965 DOA: 08/14/2018  PCP: Thayer Headings, MD (Inactive)  Patient coming from: Home  Chief Complaint: SOB  HPI: [name] is a 53 y.o. female with medical history significant of hypothyroidism, HTN, asthma who presents for SOB and palpitations.  She noted symptoms starting about 1 and a half weeks ago where she could not breathe.  She presented to her allergist who felt she was having an asthma attack and gave her prednisone and advised she use her inhaler more. However, this did not improve her symptoms and the night prior to admission she could not breathe lying down and had severe palpitations.  She presented to her PCP who sent her for a CT scan which showed pulmonary emboli with right heart strain.  She is on OCP.  She denies recent travel by car or plane.  She has had some chest discomfort and difficulty taking a deep breath.  Starting last night, she felt some leg tightness, particularly on the right behind her right knee.  She has not had any recent surgery.  She reported no family members with clotting history.   ED Course: In the ED, she was started on a heparin ggt.   Review of Systems: As per HPI otherwise 10 point review of systems negative.   Past Medical History:  Diagnosis Date  . Asthma   . Hypertension   . Thyroid disease     Past Surgical History:  Procedure Laterality Date  . PARATHYROIDECTOMY     Reviewed  reports that she has never smoked. She has never used smokeless tobacco. She reports that she does not drink alcohol or use drugs.  Allergies  Allergen Reactions  . Dilaudid [Hydromorphone Hcl]   . Septra [Sulfamethoxazole-Trimethoprim]   . Other Rash    ALPHA GAL RED MEATS    Family History  Problem Relation Age of Onset  . Clotting disorder Neg Hx      Prior to Admission medications   Medication Sig Start Date End Date Taking? Authorizing Provider  albuterol (PROAIR HFA) 108 (90 Base)  MCG/ACT inhaler Inhale 4 puffs into the lungs every 4 (four) hours as needed for wheezing or shortness of breath. 07/31/16  Yes Alfonse Spruce, MD  diphenhydrAMINE (BENADRYL) 25 MG tablet Take 25 mg by mouth daily as needed for allergies.   Yes [provider]  famotidine (PEPCID) 20 MG tablet Take 20 mg by mouth 2 (two) times daily.   Yes [provider]  Fluticasone Furoate (ARNUITY ELLIPTA) 200 MCG/ACT AEPB Inhale 1 puff into the lungs daily. 08/11/18  Yes Alfonse Spruce, MD  levothyroxine (SYNTHROID, LEVOTHROID) 150 MCG tablet Take 150 mcg by mouth daily. 07/09/16  Yes [provider]  lisinopril-hydrochlorothiazide (PRINZIDE,ZESTORETIC) 10-12.5 MG tablet Take 1 tablet by mouth daily.   Yes [provider]  LO LOESTRIN FE 1 MG-10 MCG / 10 MCG tablet Take 1 tablet by mouth daily. 08/12/18  Yes [provider]  predniSONE (DELTASONE) 10 MG tablet Take 3 tabs ( ) twice daily for 3 days, then 2 tabs ( ) twice daily for 3 days, then 1 tab ( ) twice daily for 3 days, then STOP. 08/11/18  Yes Alfonse Spruce, MD  famotidine (PEPCID) 20 MG tablet Take 1 tablet (20 mg total) by mouth 2 (two) times daily. 04/09/18 05/09/18  Alfonse Spruce, MD    Physical Exam:  Constitutional: NAD, calm, lying in bed Vitals:   08/14/18 1430  08/14/18 1445 08/14/18 1500 08/14/18 1530  BP: (!) 144/99 (!) 150/104 (!) 152/91 120/71  Pulse: (!) 112 (!) 114 (!) 116 (!) 115  Resp: (!) 28 16 (!) 24 (!) 23  Temp:      TempSrc:      SpO2: 97% 97% 97% 98%  Weight:      Height:       Eyes:  lids and conjunctivae normal ENMT: Mucous membranes are moist. Normal dentition.  Neck: normal, supple, no masses.  She has lower neck swelling which is supple, ? thyroid Respiratory: CTAB, cannot take a deep breath, somewhat tacypneic, Anthony in place Cardiovascular: Tachycardic, regular, no murmur Abdomen: +BS, NT, ND Musculoskeletal: no clubbing / cyanosis. Normal  muscle tone.  Her right leg appears slightly larger than the left. She has tenderness posterior to the knee Skin: no lesions, ulcers on exposed skin.  She has a red macular rash on upper chest and arms which she reports is chronic.  Neurologic: CN 2-12 grossly intact. Strength 5/5 in all 4.  Psychiatric: Normal judgment and insight. Alert and oriented x 3. Normal mood.    Labs on Admission: I have personally reviewed following labs and imaging studies  CBC: Recent Labs  Lab 08/14/18 1417  WBC 16.7*  NEUTROABS 11.8*  HGB 15.1*  HCT 43.9  MCV 86.8  PLT 226   Basic Metabolic Panel: Recent Labs  Lab 08/14/18 1251 08/14/18 1417  NA  --  137  K  --  3.7  CL  --  101  CO2  --  22  GLUCOSE  --  153*  BUN  --  15  CREATININE 1.00 1.10*  CALCIUM  --  8.2*   GFR: Estimated Creatinine Clearance: 79.2 mL/min (A) (by C-G formula based on SCr of 1.1 mg/dL (H)). Liver Function Tests: No results for input(s): AST, ALT, ALKPHOS, BILITOT, PROT, ALBUMIN in the last 168 hours. No results for input(s): LIPASE, AMYLASE in the last 168 hours. No results for input(s): AMMONIA in the last 168 hours. Coagulation Profile: No results for input(s): INR, PROTIME in the last 168 hours. Cardiac Enzymes: Recent Labs  Lab 08/14/18 1417  TROPONINI 0.27*   BNP (last 3 results) No results for input(s): PROBNP in the last 8760 hours. HbA1C: No results for input(s): HGBA1C in the last 72 hours. CBG: No results for input(s): GLUCAP in the last 168 hours. Lipid Profile: No results for input(s): CHOL, HDL, LDLCALC, TRIG, CHOLHDL, LDLDIRECT in the last 72 hours. Thyroid Function Tests: No results for input(s): TSH, T4TOTAL, FREET4, T3FREE, THYROIDAB in the last 72 hours. Anemia Panel: No results for input(s): VITAMINB12, FOLATE, FERRITIN, TIBC, IRON, RETICCTPCT in the last 72 hours. Urine analysis:    Component Value Date/Time   COLORURINE YELLOW 12/25/2007 1949   APPEARANCEUR CLOUDY (A)  12/25/2007 1949   LABSPEC 1.010 01/01/2010 2103   PHURINE 5.0 01/01/2010 2103   GLUCOSEU NEGATIVE 01/01/2010 2103   HGBUR TRACE (A) 01/01/2010 2103   BILIRUBINUR NEGATIVE 01/01/2010 2103   KETONESUR NEGATIVE 01/01/2010 2103   PROTEINUR NEGATIVE 01/01/2010 2103   UROBILINOGEN 0.2 01/01/2010 2103   NITRITE NEGATIVE 01/01/2010 2103   LEUKOCYTESUR (A) 01/01/2010 2103    LARGE Biochemical Testing Only. Please order routine urinalysis from main lab if confirmatory testing is needed.    Radiological Exams on Admission: Ct Angio Chest Pe W Or Wo Contrast  Result Date: 08/14/2018 CLINICAL DATA:  Chest pain and shortness of breath. Lower leg pain. EXAM: CT ANGIOGRAPHY CHEST WITH  CONTRAST TECHNIQUE: Multidetector CT imaging of the chest was performed using the standard protocol during bolus administration of intravenous contrast. Multiplanar CT image reconstructions and MIPs were obtained to evaluate the vascular anatomy. CONTRAST:  35mL OMNIPAQUE IOHEXOL 350 MG/ML SOLN COMPARISON:  Abdomen and pelvis CT dated 01/19/2004. FINDINGS: Cardiovascular: Multiple large emboli involving the main pulmonary arteries and their branches as well as segmental and subsegmental branches bilaterally. Significant dilatation of the right ventricle relative to the left ventricle with a right ventricular to left ventricular ratio of 2.16. Mediastinum/Nodes: No enlarged mediastinal, hilar, or axillary lymph nodes. Surgical clips in the expected position of the thyroid gland. Lungs/Pleura: Small amount of patchy and confluent density in the superior aspect of the superior segment of the left lower lobe. Otherwise, clear lungs. No pleural fluid. Upper Abdomen: Diffuse low density of the liver relative to the spleen. 2 small right renal calculi, the larger measuring 6 mm. The included portion of the right kidney is also smaller than the left kidney. There is also a partially included solid mass-like area in the mid left kidney  anteriorly and laterally measuring 3.6 x 2.9 cm on image number 95 series 5. 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 Since this is not included in its entirety, an interval mass can not be excluded. Musculoskeletal: Thoracic and lower cervical spine degenerative changes. No evidence of bony metastatic disease. Review of the MIP images confirms the above findings. IMPRESSION: 1. Positive for acute, marked bilateral pulmonary embolism with CT evidence of right heart strain (RV/LV Ratio = 2.16) consistent with at least submassive (intermediate risk) PE. The presence of right heart strain has been associated with an increased risk of morbidity and mortality. Please activate Code PE by paging [phone]. 2. 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 However, since this is not included in its entirety today, a mass can not be excluded. Further evaluation with an abdomen and pelvis CT without and with contrast or MRI of the abdomen without and with contrast is recommended. 3. Two small, nonobstructing right renal calculi. 4. Right renal atrophy. 5. Diffuse hepatic steatosis. Critical Value/emergent results were called by telephone at the time of interpretation on 08/14/2018 at 1:56 pm to Arthor Captain, PA in the [facility] Emergency Department in Dr. Irena Reichmann' absence and due to inability to reach an on call provider for Dr. Thomasena Edis. Arthor Captain verbally acknowledged these results and is arranging for the patient to be seen in the [facility] ED directly from the CT department. Electronically Signed   By: Beckie Salts M.D.   On: 08/14/2018 14:12    EKG: Independently reviewed. Sinus tachycardia, QT prolongation.   Assessment/Plan Submassive Pulmonary embolism with right heart strain Elevated  troponin - Seen on CT scan - TTE ordered - Telemetry - Trend troponin - Heparin ggt running - Pain control with tylenol (allergic to hydromorphone), nausea control with zofran - Transition to oral anticoagulation when patient able - Cause is unclear.  She is on OCP, likely provoked.  She has had no surgery, stasis, recent trip, diagnosis of cancer. She is 53 years old.  Mammogram and PAP smears are NOT up to date per chart review.  (due for a PAP this year, due for mammogram this year).   - Antithrombin 3 ordered  by ED, consider hypercoag work up in future.   - She does note recent decreased exercise and increased sedentary lifestyle. She is a non smoker - Ultrasound LE given pain at back of knee.  - STOP OCP - d/c prednisone - Given the nature of her PE, continued symptoms and right heart strain, she is at risk for complications and requires further monitoring with telemetry, etc.   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  Trend with BMET daily while in house.      DVT prophylaxis: Heparin ggt  Code Status: Full  Disposition Plan: Admit for heparin ggt Consults called: None  Admission status: med tele INP    Debe Coder MD [facility]  If 7PM-7AM, please contact night-coverage [email or web address]   08/14/2018, 5:17 PM

## 2018-08-14 NOTE — ED Provider Notes (Signed)
[name] EMERGENCY DEPARTMENT Provider Note   CSN: [id number] Arrival date & time: 08/14/18  1402    History   Chief Complaint Chief Complaint  Patient presents with  . Shortness of Breath    HPI [name] is a 53 y.o. female.     HPI Patient presents with pulmonary embolism.  Is around 2 weeks of URI type symptoms.  Cough.  Has been seen by her asthma doctor thought it was URI/asthma.  However saw her primary care doctor who then ordered a CT scan.  States yesterday she became more short of breath.  Worse with exertion.  Now developed some pain behind her right knee.  Had previously had cough with some sputum production.  She is on a Loestrin birth control pill.  No family history of clots.  No personal history of clot or malignancy.  Had outpatient CT angiography that showed large bilateral/saddle pulmonary embolism.  Also has right heart strain. Past Medical History:  Diagnosis Date  . Asthma   . Hypertension   . Thyroid disease     Patient Active Problem List   Diagnosis Date Noted  . Gastroesophageal reflux disease 01/28/2017  . Adverse food reaction 01/28/2017  . 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 Perennial allergic rhinitis 08/28/2015    Past Surgical History:  Procedure Laterality Date  . PARATHYROIDECTOMY       OB History   No obstetric history on file.      Home Medications    Prior to Admission medications   Medication Sig Start Date End Date Taking? Authorizing Provider  albuterol (PROAIR HFA) 108 (90 Base) MCG/ACT inhaler Inhale 4 puffs into the lungs every 4 (four) hours as needed for wheezing or shortness of breath. 07/31/16   Alfonse Spruce, MD  famotidine (PEPCID) 20 MG tablet Take 1 tablet (20 mg total) by mouth 2 (two) times daily. 04/09/18 05/09/18  Alfonse Spruce, MD  Fluticasone Furoate (ARNUITY ELLIPTA) 200 MCG/ACT AEPB Inhale 1 puff into the lungs daily. 08/11/18   Alfonse Spruce, MD    levothyroxine (SYNTHROID, LEVOTHROID) 150 MCG tablet Take 150 mcg by mouth daily. 07/09/16   [provider]  lisinopril-hydrochlorothiazide (PRINZIDE,ZESTORETIC) 10-12.5 MG tablet Take 1 tablet by mouth daily.    [provider]  naproxen (NAPROSYN) 500 MG tablet naproxen 500 mg tablet    [provider]  omeprazole (PRILOSEC) 20 MG capsule omeprazole 20 mg capsule,delayed release    [provider]  predniSONE (DELTASONE) 10 MG tablet Take 3 tabs (30mg ) twice daily for 3 days, then 2 tabs (20mg ) twice daily for 3 days, then 1 tab (10mg ) twice daily for 3 days, then STOP. 08/11/18   Alfonse Spruce, MD    Family History History reviewed. No pertinent family history.  Social History Social History   Tobacco Use  . Smoking status: Never Smoker  . Smokeless tobacco: Never Used  Substance Use Topics  . Alcohol use: No    Alcohol/week: 0.0 standard drinks  . Drug use: No     Allergies   Dilaudid [hydromorphone hcl]; Septra [sulfamethoxazole-trimethoprim]; and Other   Review of Systems Review of Systems  Constitutional: Negative for appetite change.  HENT: Positive for congestion.   Respiratory: Positive for cough and shortness of breath.   Cardiovascular: Positive for leg swelling. Negative for chest pain.  Gastrointestinal: Negative for abdominal pain.  Genitourinary: Negative for flank pain.  Musculoskeletal: Negative for back pain.  Skin:  Negative for rash.  Neurological: Negative for weakness.  Psychiatric/Behavioral: Negative for confusion.     Physical Exam Updated Vital Signs BP (!) 150/104   Pulse (!) 114   Temp 98.4 F (36.9 C) (Oral)   Resp 16   Ht 5\' 6"  (1.676 m)   Wt 116.1 kg   LMP 08/10/2018 (Exact Date) Comment: pt reports heaviest menses ever  SpO2 97%   BMI 41.32 kg/m   Physical Exam Vitals signs and nursing note reviewed.  HENT:     Head: Normocephalic.  Neck:     Musculoskeletal: Neck supple.   Cardiovascular:     Rate and Rhythm: Tachycardia present.  Pulmonary:     Comments: Mildly harsh breath sounds. Chest:     Chest wall: No tenderness.  Abdominal:     Tenderness: There is no abdominal tenderness.  Musculoskeletal:     Comments: Tenderness behind the knee on the right.  Skin:    General: Skin is warm.     Capillary Refill: Capillary refill takes less than 2 seconds.  Neurological:     General: No focal deficit present.     Mental Status: She is alert.  Psychiatric:        Mood and Affect: Mood is anxious.      ED Treatments / Results  Labs (all labs ordered are listed, but only abnormal results are displayed) Labs Reviewed  ANTITHROMBIN III  PROTEIN C ACTIVITY  PROTEIN C, TOTAL  PROTEIN S ACTIVITY  PROTEIN S, TOTAL  LUPUS ANTICOAGULANT PANEL  BETA-2-GLYCOPROTEIN I ABS, IGG/M/A  HOMOCYSTEINE  FACTOR 5 LEIDEN  PROTHROMBIN GENE MUTATION  CARDIOLIPIN ANTIBODIES, IGG, IGM, IGA  PROTIME-INR  CBC WITH DIFFERENTIAL/PLATELET  BASIC METABOLIC PANEL  TROPONIN I  HEPARIN LEVEL (UNFRACTIONATED)    EKG None  Radiology Ct Angio Chest Pe W Or Wo Contrast  Result Date: 08/14/2018 CLINICAL DATA:  Chest pain and shortness of breath. Lower leg pain. EXAM: CT ANGIOGRAPHY CHEST WITH CONTRAST TECHNIQUE: Multidetector CT imaging of the chest was performed using the standard protocol during bolus administration of intravenous contrast. Multiplanar CT image reconstructions and MIPs were obtained to evaluate the vascular anatomy. CONTRAST:  38mL OMNIPAQUE IOHEXOL 350 MG/ML SOLN COMPARISON:  Abdomen and pelvis CT dated 01/19/2004. FINDINGS: Cardiovascular: Multiple large emboli involving the main pulmonary arteries and their branches as well as segmental and subsegmental branches bilaterally. Significant dilatation of the right ventricle relative to the left ventricle with a right ventricular to left ventricular ratio of 2.16. Mediastinum/Nodes: No enlarged mediastinal, hilar,  or axillary lymph nodes. Surgical clips in the expected position of the thyroid gland. Lungs/Pleura: Small amount of patchy and confluent density in the superior aspect of the superior segment of the left lower lobe. Otherwise, clear lungs. No pleural fluid. Upper Abdomen: Diffuse low density of the liver relative to the spleen. 2 small right renal calculi, the larger measuring 6 mm. The included portion of the right kidney is also smaller than the left kidney. There is also a partially included solid mass-like area in the mid left kidney anteriorly and laterally measuring 3.6 x 2.9 cm on image number 95 series 5. 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 Since this is not included in its entirety, an interval mass can not be excluded. Musculoskeletal: Thoracic and lower cervical spine degenerative changes. No evidence of bony metastatic disease. Review of the MIP images confirms the  above findings. IMPRESSION: 1. Positive for acute, marked bilateral pulmonary embolism with CT evidence of right heart strain (RV/LV Ratio = 2.16) consistent with at least submassive (intermediate risk) PE. The presence of right heart strain has been associated with an increased risk of morbidity and mortality. Please activate Code PE by paging [phone]. 2. 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 However, since this is not included in its entirety today, a mass can not be excluded. Further evaluation with an abdomen and pelvis CT without and with contrast or MRI of the abdomen without and with contrast is recommended. 3. Two small, nonobstructing right renal calculi. 4. Right renal atrophy. 5. Diffuse hepatic steatosis. Critical Value/emergent results were called by telephone at the time of interpretation  on 08/14/2018 at 1:56 pm to Arthor Captain, PA in the [facility] Emergency Department in Dr. Irena Reichmann' absence and due to inability to reach an on call provider for Dr. Thomasena Edis. Arthor Captain verbally acknowledged these results and is arranging for the patient to be seen in the [facility] ED directly from the CT department. Electronically Signed   By: Beckie Salts M.D.   On: 08/14/2018 14:12    Procedures Procedures (including critical care time)  Medications Ordered in ED Medications  heparin ADULT infusion 100 units/mL (25000 units/254mL sodium chloride 0.45%) (1,500 Units/hr Intravenous New Bag/Given 08/14/18 1445)  heparin bolus via infusion 6,700 Units (6,700 Units Intravenous Bolus from Bag 08/14/18 1445)     Initial Impression / Assessment and Plan / ED Course  I have reviewed the triage vital signs and the nursing notes.  Pertinent labs & imaging results that were available during my care of the patient were reviewed by me and considered in my medical decision making (see chart for details).        Patient with pulmonary embolism.  Large on CT with right heart strain.  Likely also has DVT in right leg with the pain behind the knee.  Not hypoxic and good blood pressure.  However does have large RV compared to LV.  Discussed with pulmonary and do not need to be involved at this point.  Will end up admitting to hospitalist after work-up more complete.  Heparin started.  Care turned over to Dr. Denton Lank  CRITICAL CARE Performed by: Benjiman Core Total critical care time: 30 minutes Critical care time was exclusive of separately billable procedures and treating other patients. Critical care was necessary to treat or prevent imminent or life-threatening deterioration. Critical care was time spent personally by me on the following activities: development of treatment plan with patient and/or surrogate as well as nursing, discussions with consultants, evaluation of patient's response to treatment,  examination of patient, obtaining history from patient or surrogate, ordering and performing treatments and interventions, ordering and review of laboratory studies, ordering and review of radiographic studies, pulse oximetry and re-evaluation of patient's condition.   Final Clinical Impressions(s) / ED Diagnoses   Final diagnoses:  Acute saddle pulmonary embolism with acute cor pulmonale [facility])    ED Discharge Orders    None       Benjiman Core, MD 08/14/18 1500

## 2018-08-14 NOTE — Progress Notes (Signed)
ANTICOAGULATION CONSULT NOTE  Pharmacy Consult for heparin Indication: pulmonary embolus  Allergies  Allergen Reactions  . Dilaudid [Hydromorphone Hcl]   . Septra [Sulfamethoxazole-Trimethoprim]   . Other Rash    ALPHA GAL RED MEATS    Patient Measurements: Height: 5\' 6"  (167.6 cm) Weight: 253 lb 12.8 oz (115.1 kg) IBW/kg (Calculated) : 59.3 Heparin Dosing Weight: 86 kg  Vital Signs: Temp: 97.9 F (36.6 C) (03/06 1832) Temp Source: Oral (03/06 1832) BP: 122/78 (03/06 1832) Pulse Rate: 117 (03/06 1832)  Labs: Recent Labs    08/14/18 1251 08/14/18 1417 08/14/18 1830 08/14/18 2025  HGB  --  15.1*  --   --   HCT  --  43.9  --   --   PLT  --  226  --   --   LABPROT  --   --  14.2  --   INR  --   --  1.1  --   HEPARINUNFRC  --   --   --  0.45  CREATININE 1.00 1.10*  --   --   TROPONINI  --  0.27*  --   --     Estimated Creatinine Clearance: 78.8 mL/min (A) (by C-G formula based on SCr of 1.1 mg/dL (H)).   Medications:  Infusions:  . heparin 1,500 Units/hr (08/14/18 1445)    Assessment: [name] presenting with acute, marked bilateral PE with RHS (RV/LV ratio = 2.16). Pharmacy consulted to dose IV heparin. Not on anticoagulation PTA.  Heparin level is therapeutic at 0.45 on 1500 units/hr. No bleeding noted, CBC is normal.   Goal of Therapy:  Heparin level 0.3-0.7 units/ml Monitor platelets by anticoagulation protocol: Yes   Plan:  Continue heparin drip at 1500 units/hr Confirmatory heparin level with am labs Daily heparin level and CBC Monitor for s/sx of bleeding F/U transition to oral agent   Loura Back, PharmD, BCPS Clinical Pharmacist Clinical phone for 08/14/2018 until 10p is [phone] 08/14/2018 10:07 PM  **Pharmacist phone directory can now be found on [email or web address] listed under [facility]**

## 2018-08-14 NOTE — ED Notes (Addendum)
ED TO INPATIENT HANDOFF REPORT  ED Nurse Name and Phone #: Pattricia Boss [phone]  S Name/Age/Gender [name] 53 y.o. female Room/Bed: 039C/039C  Code Status   Code Status: Not on file  Home/SNF/Other Home Patient oriented to: self, place, time and situation Is this baseline? Yes   Triage Complete: Triage complete  Chief Complaint sob  Triage Note Pt presents to ED after visiting her PCP for intermittent SOB.  Pt reports worsening of SOB starting last night around 5pm.  Pt has also been fighting/treating a cold however this was more significant per pt.  Pt is alert and oriented upon arrival to ED.   Allergies Allergies  Allergen Reactions  . Dilaudid [Hydromorphone Hcl]   . Septra [Sulfamethoxazole-Trimethoprim]   . Other Rash    ALPHA GAL RED MEATS    Level of Care/Admitting Diagnosis ED Disposition    ED Disposition Condition Comment   Admit  Hospital Area: [facility] [[id number]]  Level of Care: Medical Telemetry [104]  Diagnosis: Pulmonary embolism [facility]) [[id number]]  Admitting Physician: Nena Polio  Attending Physician: Nena Polio  Estimated length of stay: past midnight tomorrow  Certification:: I certify this patient will need inpatient services for at least 2 midnights  PT Class (Do Not Modify): Inpatient [101]  PT Acc Code (Do Not Modify): Private [1]       B Medical/Surgery History Past Medical History:  Diagnosis Date  . Asthma   . Hypertension   . Thyroid disease    Past Surgical History:  Procedure Laterality Date  . PARATHYROIDECTOMY       A IV Location/Drains/Wounds Patient Lines/Drains/Airways Status   Active Line/Drains/Airways    Name:   Placement date:   Placement time:   Site:   Days:   Peripheral IV 08/14/18 Left;Anterior Antecubital   08/14/18    1251    Antecubital   less than 1          Intake/Output Last 24 hours No intake or output data in the 24 hours ending 08/14/18  1800  Labs/Imaging Results for orders placed or performed during the hospital encounter of 08/14/18 (from the past 48 hour(s))  CBC with Differential     Status: Abnormal   Collection Time: 08/14/18  2:17 PM  Result Value Ref Range   WBC 16.7 (H) 4.0 - 10.5 K/uL   RBC 5.06 3.87 - 5.11 MIL/uL   Hemoglobin 15.1 (H) 12.0 - 15.0 g/dL   HCT 16.1 09.6 - 04.5 %   MCV 86.8 80.0 - 100.0 fL   MCH 29.8 26.0 - 34.0 pg   MCHC 34.4 30.0 - 36.0 g/dL   RDW 40.9 81.1 - 91.4 %   Platelets 226 150 - 400 K/uL    Comment: REPEATED TO VERIFY   nRBC 0.0 0.0 - 0.2 %   Neutrophils Relative % 71 %   Neutro Abs 11.8 (H) 1.7 - 7.7 K/uL   Lymphocytes Relative 24 %   Lymphs Abs 4.1 (H) 0.7 - 4.0 K/uL   Monocytes Relative 4 %   Monocytes Absolute 0.7 0.1 - 1.0 K/uL   Eosinophils Relative 0 %   Eosinophils Absolute 0.0 0.0 - 0.5 K/uL   Basophils Relative 0 %   Basophils Absolute 0.0 0.0 - 0.1 K/uL   Immature Granulocytes 1 %   Abs Immature Granulocytes 0.08 (H) 0.00 - 0.07 K/uL    Comment: Performed at [facility], [address], [place], [place] [address]  Basic metabolic panel     Status: Abnormal   Collection Time: 08/14/18  2:17 PM  Result Value Ref Range   Sodium 137 135 - 145 mmol/L   Potassium 3.7 3.5 - 5.1 mmol/L   Chloride 101 98 - 111 mmol/L   CO2 22 22 - 32 mmol/L   Glucose, Bld 153 (H) 70 - 99 mg/dL   BUN 15 6 - 20 mg/dL   Creatinine, Ser 1.15 (H) 0.44 - 1.00 mg/dL   Calcium 8.2 (L) 8.9 - 10.3 mg/dL   GFR calc non Af Amer 58 (L) >60 mL/min   GFR calc Af Amer >60 >60 mL/min   Anion gap 14 5 - 15    Comment: Performed at [facility], [address] [place], [place] [address]  Troponin I - Once     Status: Abnormal   Collection Time: 08/14/18  2:17 PM  Result Value Ref Range   Troponin I 0.27 (HH) <0.03 ng/mL    Comment: CRITICAL RESULT CALLED TO, READ BACK BY AND VERIFIED WITH: S BAILEY,RN 1600 08/14/2018 D BRADLEY Performed at [facility], [address]  [place], [place] [address]    Ct Angio Chest Pe W Or Wo Contrast  Result Date: 08/14/2018 CLINICAL DATA:  Chest pain and shortness of breath. Lower leg pain. EXAM: CT ANGIOGRAPHY CHEST WITH CONTRAST TECHNIQUE: Multidetector CT imaging of the chest was performed using the standard protocol during bolus administration of intravenous contrast. Multiplanar CT image reconstructions and MIPs were obtained to evaluate the vascular anatomy. CONTRAST:  4mL OMNIPAQUE IOHEXOL 350 MG/ML SOLN COMPARISON:  Abdomen and pelvis CT dated 01/19/2004. FINDINGS: Cardiovascular: Multiple large emboli involving the main pulmonary arteries and their branches as well as segmental and subsegmental branches bilaterally. Significant dilatation of the right ventricle relative to the left ventricle with a right ventricular to left ventricular ratio of 2.16. Mediastinum/Nodes: No enlarged mediastinal, hilar, or axillary lymph nodes. Surgical clips in the expected position of the thyroid gland. Lungs/Pleura: Small amount of patchy and confluent density in the superior aspect of the superior segment of the left lower lobe. Otherwise, clear lungs. No pleural fluid. Upper Abdomen: Diffuse low density of the liver relative to the spleen. 2 small right renal calculi, the larger measuring 6 mm. The included portion of the right kidney is also smaller than the left kidney. There is also a partially included solid mass-like area in the mid left kidney anteriorly and laterally measuring 3.6 x 2.9 cm on image number 95 series 5. 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 Since this is not included in its entirety, an interval mass can not be excluded. Musculoskeletal: Thoracic and lower cervical spine degenerative changes. No evidence of bony metastatic disease. Review of the MIP images confirms the above findings. IMPRESSION: 1. Positive for acute,  marked bilateral pulmonary embolism with CT evidence of right heart strain (RV/LV Ratio = 2.16) consistent with at least submassive (intermediate risk) PE. The presence of right heart strain has been associated with an increased risk of morbidity and mortality. Please activate Code PE by paging [phone]. 2. 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 However, since this is not included in its entirety today, a mass can not be excluded. Further evaluation with an abdomen  and pelvis CT without and with contrast or MRI of the abdomen without and with contrast is recommended. 3. Two small, nonobstructing right renal calculi. 4. Right renal atrophy. 5. Diffuse hepatic steatosis. Critical Value/emergent results were called by telephone at the time of interpretation on 08/14/2018 at 1:56 pm to Arthor CaptainAbigail Harris, PA in the [facility] Emergency Department in Dr. Irena ReichmannANA COLLINS' absence and due to inability to reach an on call provider for Dr. Thomasena Edisollins. Arthor CaptainAbigail Harris verbally acknowledged these results and is arranging for the patient to be seen in the [facility] ED directly from the CT department. Electronically Signed   By: Beckie SaltsSteven  Reid M.D.   On: 08/14/2018 14:12    Pending Labs Unresulted Labs (From admission, onward)    Start     Ordered   08/15/18 0500  Heparin level (unfractionated)  Daily,   R     08/14/18 1422   08/15/18 0500  CBC  Daily,   R     08/14/18 1422   08/14/18 2100  Heparin level (unfractionated)  Once-Timed,   R     08/14/18 1422   08/14/18 1520  Antithrombin III  Once,   STAT     08/14/18 1520   08/14/18 1520  Protime-INR  Once,   STAT     08/14/18 1520   08/14/18 1417  Protein C activity  (Hypercoagulable Panel, Comprehensive (PNL))  Once,   R     08/14/18 1416   08/14/18 1417  Protein C, total  (Hypercoagulable Panel, Comprehensive (PNL))  Once,   R     08/14/18 1416   08/14/18 1417   Protein S activity  (Hypercoagulable Panel, Comprehensive (PNL))  Once,   R     08/14/18 1416   08/14/18 1417  Protein S, total  (Hypercoagulable Panel, Comprehensive (PNL))  Once,   R     08/14/18 1416   08/14/18 1417  Lupus anticoagulant panel  (Hypercoagulable Panel, Comprehensive (PNL))  Once,   R     08/14/18 1416   08/14/18 1417  Beta-2-glycoprotein i abs, IgG/M/A  (Hypercoagulable Panel, Comprehensive (PNL))  Once,   R     08/14/18 1416   08/14/18 1417  Homocysteine, serum  (Hypercoagulable Panel, Comprehensive (PNL))  Once,   R     08/14/18 1416   08/14/18 1417  Factor 5 leiden  (Hypercoagulable Panel, Comprehensive (PNL))  Once,   R     08/14/18 1416   08/14/18 1417  Prothrombin gene mutation  (Hypercoagulable Panel, Comprehensive (PNL))  Once,   R     08/14/18 1416   08/14/18 1417  Cardiolipin antibodies, IgG, IgM, IgA  (Hypercoagulable Panel, Comprehensive (PNL))  Once,   R     08/14/18 1416   Signed and Held  HIV antibody (Routine Testing)  Once,   R     Signed and Held   Signed and Held  Basic metabolic panel  Tomorrow morning,   R     Signed and Held          Vitals/Pain Today's Vitals   08/14/18 1445 08/14/18 1500 08/14/18 1530 08/14/18 1730  BP: (!) 150/104 (!) 152/91 120/71 126/74  Pulse: (!) 114 (!) 116 (!) 115 (!) 113  Resp: 16 (!) 24 (!) 23 (!) 25  Temp:      TempSrc:      SpO2: 97% 97% 98% 98%  Weight:      Height:      PainSc:        Isolation  Precautions No active isolations  Medications Medications  heparin ADULT infusion 100 units/mL (25000 units/22mL sodium chloride 0.45%) (1,500 Units/hr Intravenous New Bag/Given 08/14/18 1445)  heparin bolus via infusion 6,700 Units (6,700 Units Intravenous Bolus from Bag 08/14/18 1445)    Mobility walks Low fall risk   Focused Assessments Cardiac Assessment Handoff:  Cardiac Rhythm: Sinus tachycardia Lab Results  Component Value Date   TROPONINI 0.27 (HH) 08/14/2018   No results found for:  DDIMER Does the Patient currently have chest pain? No  , Pulmonary Assessment Handoff:  Lung sounds: Bilateral Breath Sounds: Diminished L Breath Sounds: Diminished R Breath Sounds: Diminished O2 Device: Room Air        R Recommendations: See Admitting Provider Note  Report given to:   Additional Notes: Pt w/ large saddle PE, have purewick in place for minimal exertion. Very pleasant, A&Ox4, GCS 15, non hypoxic, slightly tachy

## 2018-08-14 NOTE — ED Triage Notes (Signed)
Pt presents to ED after visiting her PCP for intermittent SOB.  Pt reports worsening of SOB starting last night around 5pm.  Pt has also been fighting/treating a cold however this was more significant per pt.  Pt is alert and oriented upon arrival to ED.

## 2018-08-15 ENCOUNTER — Inpatient Hospital Stay (HOSPITAL_COMMUNITY): Payer: BLUE CROSS/BLUE SHIELD

## 2018-08-15 DIAGNOSIS — I2602 Saddle embolus of pulmonary artery with acute cor pulmonale: Principal | ICD-10-CM

## 2018-08-15 DIAGNOSIS — M7989 Other specified soft tissue disorders: Secondary | ICD-10-CM

## 2018-08-15 DIAGNOSIS — K219 Gastro-esophageal reflux disease without esophagitis: Secondary | ICD-10-CM

## 2018-08-15 DIAGNOSIS — R52 Pain, unspecified: Secondary | ICD-10-CM

## 2018-08-15 DIAGNOSIS — I2699 Other pulmonary embolism without acute cor pulmonale: Secondary | ICD-10-CM

## 2018-08-15 LAB — CARDIOLIPIN ANTIBODIES, IGG, IGM, IGA
Anticardiolipin IgA: <9 APL U/mL (ref 0–11)
Anticardiolipin IgG: <9 GPL U/mL (ref 0–14)
Anticardiolipin IgM: <9 MPL U/mL (ref 0–12)

## 2018-08-15 LAB — CBC
HCT: 41 % (ref 36.0–46.0)
Hemoglobin: 13.9 g/dL (ref 12.0–15.0)
MCH: 29.1 pg (ref 26.0–34.0)
MCHC: 33.9 g/dL (ref 30.0–36.0)
MCV: 86 fL (ref 80.0–100.0)
Platelets: 210 10*3/uL (ref 150–400)
RBC: 4.77 MIL/uL (ref 3.87–5.11)
RDW: 12.5 % (ref 11.5–15.5)
WBC: 14.1 10*3/uL — ABNORMAL HIGH (ref 4.0–10.5)
nRBC: 0 % (ref 0.0–0.2)

## 2018-08-15 LAB — LUPUS ANTICOAGULANT PANEL
DRVVT: 34 s (ref 0.0–47.0)
PTT LA: 27 s (ref 0.0–51.9)

## 2018-08-15 LAB — BASIC METABOLIC PANEL
ANION GAP: 12 (ref 5–15)
BUN: 13 mg/dL (ref 6–20)
CO2: 18 mmol/L — ABNORMAL LOW (ref 22–32)
Calcium: 7.9 mg/dL — ABNORMAL LOW (ref 8.9–10.3)
Chloride: 104 mmol/L (ref 98–111)
Creatinine, Ser: 1.06 mg/dL — ABNORMAL HIGH (ref 0.44–1.00)
GFR calc Af Amer: >60 mL/min (ref >60)
GFR calc non Af Amer: >60 mL/min (ref >60)
Glucose, Bld: 165 mg/dL — ABNORMAL HIGH (ref 70–99)
Potassium: 3.1 mmol/L — ABNORMAL LOW (ref 3.5–5.1)
Sodium: 134 mmol/L — ABNORMAL LOW (ref 135–145)

## 2018-08-15 LAB — HIV ANTIBODY (ROUTINE TESTING W REFLEX): HIV Screen 4th Generation wRfx: NONREACTIVE

## 2018-08-15 LAB — PROTEIN S ACTIVITY: PROTEIN S ACTIVITY: 102 % (ref 63–140)

## 2018-08-15 LAB — BETA-2-GLYCOPROTEIN I ABS, IGG/M/A
Beta-2 Glyco I IgG: <9 GPI IgG units (ref 0–20)
Beta-2-Glycoprotein I IgA: <9 GPI IgA units (ref 0–25)
Beta-2-Glycoprotein I IgM: <9 GPI IgM units (ref 0–32)

## 2018-08-15 LAB — HEPARIN LEVEL (UNFRACTIONATED)
Heparin Unfractionated: 0.28 IU/mL — ABNORMAL LOW (ref 0.30–0.70)
Heparin Unfractionated: 0.28 IU/mL — ABNORMAL LOW (ref 0.30–0.70)
Heparin Unfractionated: 0.38 IU/mL (ref 0.30–0.70)

## 2018-08-15 LAB — TSH: TSH: 15.375 u[IU]/mL — ABNORMAL HIGH (ref 0.350–4.500)

## 2018-08-15 LAB — PROTEIN C, TOTAL: Protein C, Total: 53 % — ABNORMAL LOW (ref 60–150)

## 2018-08-15 LAB — MAGNESIUM: Magnesium: 2.2 mg/dL (ref 1.7–2.4)

## 2018-08-15 LAB — PROTEIN C ACTIVITY: Protein C Activity: 69 % — ABNORMAL LOW (ref 73–180)

## 2018-08-15 LAB — ECHOCARDIOGRAM COMPLETE
HEIGHTINCHES: 66 in
Weight: 4060.77 oz

## 2018-08-15 LAB — PROTEIN S, TOTAL: Protein S Ag, Total: 99 % (ref 60–150)

## 2018-08-15 MED ORDER — AMLODIPINE BESYLATE 10 MG PO TABS: 10.0000 mg | ORAL_TABLET | Freq: Every day | ORAL | Status: DC

## 2018-08-15 MED ORDER — POTASSIUM CHLORIDE CRYS ER 20 MEQ PO TBCR
40.0000 meq | EXTENDED_RELEASE_TABLET | Freq: Two times a day (BID) | ORAL | Status: DC
  Administered 2018-08-15 – 2018-08-17 (×5): 40 meq via ORAL
  Filled 2018-08-15 (×5): qty 2

## 2018-08-15 MED ORDER — HEPARIN BOLUS VIA INFUSION
850.0000 [IU] | Freq: Once | INTRAVENOUS | Status: AC
  Administered 2018-08-15: 850 [IU] via INTRAVENOUS
  Filled 2018-08-15: qty 850

## 2018-08-15 MED ORDER — IOHEXOL 300 MG/ML  SOLN
100.0000 mL | Freq: Once | INTRAMUSCULAR | Status: AC | PRN
  Administered 2018-08-15: 100 mL via INTRAVENOUS

## 2018-08-15 MED ORDER — APIXABAN 5 MG PO TABS
10.0000 mg | ORAL_TABLET | Freq: Two times a day (BID) | ORAL | Status: DC
  Administered 2018-08-16 – 2018-08-17 (×3): 10 mg via ORAL
  Filled 2018-08-15 (×3): qty 2

## 2018-08-15 MED ORDER — PERFLUTREN LIPID MICROSPHERE
1.0000 mL | INTRAVENOUS | Status: AC | PRN
  Administered 2018-08-15: 2 mL via INTRAVENOUS
  Filled 2018-08-15: qty 10

## 2018-08-15 MED ORDER — LACTATED RINGERS IV SOLN
INTRAVENOUS | Status: AC
  Administered 2018-08-15: 13:00:00 via INTRAVENOUS

## 2018-08-15 MED ORDER — GUAIFENESIN-DM 100-10 MG/5ML PO SYRP
10.0000 mL | ORAL_SOLUTION | ORAL | Status: DC | PRN
  Administered 2018-08-15 – 2018-08-17 (×7): 10 mL via ORAL
  Filled 2018-08-15 (×7): qty 10

## 2018-08-15 MED ORDER — APIXABAN 5 MG PO TABS: 5.0000 mg | ORAL_TABLET | Freq: Two times a day (BID) | ORAL | Status: DC

## 2018-08-15 MED ORDER — AMLODIPINE BESYLATE 10 MG PO TABS
10.0000 mg | ORAL_TABLET | Freq: Every day | ORAL | Status: DC
  Administered 2018-08-16: 10 mg via ORAL
  Filled 2018-08-15: qty 1

## 2018-08-15 NOTE — Progress Notes (Signed)
ANTICOAGULATION CONSULT NOTE  Pharmacy Consult for heparin and apixaban Indication: pulmonary embolus and RLE DVT  Allergies  Allergen Reactions  . Dilaudid [Hydromorphone Hcl]   . Septra [Sulfamethoxazole-Trimethoprim]   . Other Rash    ALPHA GAL RED MEATS    Patient Measurements: Height: 5\' 6"  (167.6 cm) Weight: 253 lb 12.8 oz (115.1 kg) IBW/kg (Calculated) : 59.3 Heparin Dosing Weight: 86.4 kg  Vital Signs: Temp: 98.6 F (37 C) (03/07 0544) Temp Source: Oral (03/07 0544) BP: 121/76 (03/07 0544) Pulse Rate: 115 (03/07 0544)  Labs: Recent Labs    08/14/18 1251 08/14/18 1417 08/14/18 1830 08/14/18 2025 08/15/18 0339 08/15/18 1232  HGB  --  15.1*  --   --  13.9  --   HCT  --  43.9  --   --  41.0  --   PLT  --  226  --   --  210  --   LABPROT  --   --  14.2  --   --   --   INR  --   --  1.1  --   --   --   HEPARINUNFRC  --   --   --  0.45 0.28* 0.28*  CREATININE 1.00 1.10*  --   --  1.06*  --   TROPONINI  --  0.27*  --   --   --   --     Estimated Creatinine Clearance: 81.8 mL/min (A) (by C-G formula based on SCr of 1.06 mg/dL (H)).   Medications:  Infusions:  . heparin 1,600 Units/hr (08/15/18 0441)  . lactated ringers 50 mL/hr at 08/15/18 1254    Assessment: 52 yof presenting with acute, marked bilateral PE with RHS (RV/LV ratio = 2.16) and RLE DVT. Pharmacy consulted to dose IV heparin with plan to switch to Eliquis on 3/8 AM. Not on anticoagulation PTA.  Heparin level, drawn about 1.5 hrs late, is slightly subtherapeutic 0.28 on 1600 units/hr. CBC is stable with no bleeding or infusion issues reported per RN.  Goal of Therapy:  Heparin level 0.3-0.7 units/ml Monitor platelets by anticoagulation protocol: Yes   Plan:  Heparin IV 850 unit bolus x1 Increase heparin drip to 1700 units/hr Check heparin level in 6 hours Daily heparin level and CBC Monitor for s/sx of bleeding Transition to Eliquis 10 mg PO BID x7 days, then 5 mg PO BID on 3/8  AM  Thanks for allowing pharmacy to be a part of this patient's care.  Arvilla Market, PharmD PGY1 Pharmacy Resident Phone [phone] 08/15/2018     2:13 PM

## 2018-08-15 NOTE — Progress Notes (Signed)
PROGRESS NOTE                                                                                                                                                                                                             Patient Demographics:    [name], is a 53 y.o. female, DOB - Sep 02, 1965, MDY:[id number]  Admit date - 08/14/2018   Admitting Physician Lisa Catalina, MD  Outpatient Primary MD for the patient is Lisa Headings, MD (Inactive)  LOS - 1  Chief Complaint  Patient presents with  . Shortness of Breath       Brief Narrative - [name] is a 53 y.o. female with medical history significant of hypothyroidism, HTN, on contraceptive pills, asthma who presents for SOB and palpitations.  Symptoms have been gradually progressive for a week, in the ER work-up suggestive of large saddle PE and she was admitted to the hospital.   Subjective:    [name] today has, No headache, No chest pain, No abdominal pain - No Nausea, No new weakness tingling or numbness, No Cough - SOB.     Assessment  & Plan :     1.  Acute hypoxic respiratory failure due to large saddle PE.  Currently on heparin drip continue, hemodynamically stable, will obtain echocardiogram, switch to Eliquis tomorrow if stable and discharge home.  Requested patient to stop taking contraceptive pills.  2.  Stable renal mass.  Obtain CT scan abdomen pelvis with and without contrast thereafter outpatient urology/PCP follow-up.  She also has incidental finding of renal nonobstructing stones.  3.  Obesity.  Follow with PCP for weight loss.  BMI was 40.\  4.  GERD.  On Zantac.  5.  Essential hypertension.  For now placed on Norvasc.  Will hold HCTZ and ACE inhibitor since she has been exposed to quite a bit of IV dye.    Family Communication  :  None  Code Status :  Full  Disposition Plan  :  TBD  Consults  :  None  Procedures  :    CT Chest - 1. Positive for acute, marked bilateral pulmonary  embolism with CT evidence of right heart strain (RV/LV Ratio = 2.16) consistent with at least submassive (intermediate risk) PE. The presence of right heart strain has been associated with an increased risk of morbidity and mortality. Please activate Code PE by paging [phone]. 2. 3.6 cm mass-like area in the anterior mid left kidney, not included in its  entirety. 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 However, since this is not included in its entirety today, a mass can not be excluded. Further evaluation with an abdomen and pelvis CT without and with contrast or MRI of the abdomen without and with contrast is recommended. 3. Two small, nonobstructing right renal calculi. 4. Right renal atrophy. 5. Diffuse hepatic steatosis.   TTE  Leg [place]  CT Abd   DVT Prophylaxis  :   Heparin   Lab Results  Component Value Date   PLT 210 08/15/2018    Diet :  Diet Order            Diet regular Room service appropriate? Yes; Fluid consistency: Thin  Diet effective now               Inpatient Medications Scheduled Meds: . budesonide (PULMICORT) nebulizer solution  0.5 mg Nebulization BID  . famotidine  20 mg Oral BID  . lisinopril  10 mg Oral Daily   And  . hydrochlorothiazide  12.5 mg Oral Daily  . levothyroxine  150 mcg Oral Q0600  . potassium chloride  40 mEq Oral BID  . sodium chloride flush  3 mL Intravenous Q12H   Continuous Infusions: . heparin 1,600 Units/hr (08/15/18 0441)   PRN Meds:.acetaminophen **OR** [DISCONTINUED] acetaminophen, albuterol, [DISCONTINUED] ondansetron **OR** ondansetron (ZOFRAN) IV  Antibiotics  :   Anti-infectives (From admission, onward)   None          Objective:   Vitals:   08/14/18 1935 08/14/18 2222 08/15/18 0544 08/15/18 0714  BP:  117/74 121/76   Pulse:  (!) 116 (!) 115   Resp:  18 18   Temp:  98 F (36.7 C) 98.6 F (37 C)   TempSrc:  Oral Oral   SpO2: 99% 95% 95% 98%  Weight:      Height:          Wt Readings from Last 3 Encounters:  08/14/18 115.1 kg  08/11/18 117 kg  04/09/18 115.8 kg     Intake/Output Summary (Last 24 hours) at 08/15/2018 0955 Last data filed at 08/15/2018 0301 Gross per 24 hour  Intake 246.31 ml  Output -  Net 246.31 ml     Physical Exam  Awake Alert, Oriented X 3, No new F.N deficits, Normal affect [place].AT,PERRAL Supple Neck,No JVD, No cervical lymphadenopathy appriciated.  Symmetrical Chest wall movement, Good air movement bilaterally, CTAB RRR,No Gallops,Rubs or new Murmurs, No Parasternal Heave +ve B.Sounds, Abd Soft, No tenderness, No organomegaly appriciated, No rebound - guarding or rigidity. No Cyanosis, Clubbing, mild R leg edema    Data Review:    CBC Recent Labs  Lab 08/14/18 1417 08/15/18 0339  WBC 16.7* 14.1*  HGB 15.1* 13.9  HCT 43.9 41.0  PLT 226 210  MCV 86.8 86.0  MCH 29.8 29.1  MCHC 34.4 33.9  RDW 12.5 12.5  LYMPHSABS 4.1*  --   MONOABS 0.7  --   EOSABS 0.0  --   BASOSABS 0.0  --     Chemistries  Recent Labs  Lab 08/14/18 1251 08/14/18 1417 08/15/18 0339 08/15/18 0709  NA  --  137 134*  --   K  --  3.7 3.1*  --   CL  --  101 104  --   CO2  --  22 18*  --   GLUCOSE  --  153* 165*  --   BUN  --  15 13  --  CREATININE 1.00 1.10* 1.06*  --   CALCIUM  --  8.2* 7.9*  --   MG  --   --   --  2.2   ------------------------------------------------------------------------------------------------------------------ No results for input(s): CHOL, HDL, LDLCALC, TRIG, CHOLHDL, LDLDIRECT in the last 72 hours.  No results found for: HGBA1C ------------------------------------------------------------------------------------------------------------------ Recent Labs    08/15/18 0339  TSH 15.375*   ------------------------------------------------------------------------------------------------------------------ No results for input(s): VITAMINB12, FOLATE, FERRITIN, TIBC, IRON, RETICCTPCT in the last 72  hours.  Coagulation profile Recent Labs  Lab 08/14/18 1830  INR 1.1    No results for input(s): DDIMER in the last 72 hours.  Cardiac Enzymes Recent Labs  Lab 08/14/18 1417  TROPONINI 0.27*   ------------------------------------------------------------------------------------------------------------------ No results found for: BNP  Micro Results No results found for this or any previous visit (from the past 240 hour(s)).  Radiology Reports Ct Angio Chest Pe W Or Wo Contrast  Result Date: 08/14/2018 CLINICAL DATA:  Chest pain and shortness of breath. Lower leg pain. EXAM: CT ANGIOGRAPHY CHEST WITH CONTRAST TECHNIQUE: Multidetector CT imaging of the chest was performed using the standard protocol during bolus administration of intravenous contrast. Multiplanar CT image reconstructions and MIPs were obtained to evaluate the vascular anatomy. CONTRAST:  46mL OMNIPAQUE IOHEXOL 350 MG/ML SOLN COMPARISON:  Abdomen and pelvis CT dated 01/19/2004. FINDINGS: Cardiovascular: Multiple large emboli involving the main pulmonary arteries and their branches as well as segmental and subsegmental branches bilaterally. Significant dilatation of the right ventricle relative to the left ventricle with a right ventricular to left ventricular ratio of 2.16. Mediastinum/Nodes: No enlarged mediastinal, hilar, or axillary lymph nodes. Surgical clips in the expected position of the thyroid gland. Lungs/Pleura: Small amount of patchy and confluent density in the superior aspect of the superior segment of the left lower lobe. Otherwise, clear lungs. No pleural fluid. Upper Abdomen: Diffuse low density of the liver relative to the spleen. 2 small right renal calculi, the larger measuring 6 mm. The included portion of the right kidney is also smaller than the left kidney. There is also a partially included solid mass-like area in the mid left kidney anteriorly and laterally measuring 3.6 x 2.9 cm on image number 95 series  5. 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 Since this is not included in its entirety, an interval mass can not be excluded. Musculoskeletal: Thoracic and lower cervical spine degenerative changes. No evidence of bony metastatic disease. Review of the MIP images confirms the above findings. IMPRESSION: 1. Positive for acute, marked bilateral pulmonary embolism with CT evidence of right heart strain (RV/LV Ratio = 2.16) consistent with at least submassive (intermediate risk) PE. The presence of right heart strain has been associated with an increased risk of morbidity and mortality. Please activate Code PE by paging [phone]. 2. 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 However, since this is not included in its entirety today, a mass can not be excluded. Further evaluation with an abdomen and pelvis CT without and with contrast or MRI of the abdomen without and with contrast is recommended. 3. Two small, nonobstructing right renal calculi. 4. Right renal atrophy. 5. Diffuse hepatic steatosis. Critical Value/emergent results were called by telephone at the time of interpretation on 08/14/2018 at 1:56 pm to Arthor Captain, PA in the [facility]gency Department in Dr. Irena Reichmann' absence and due to inability to reach an on call provider for Dr. Thomasena Edis. Arthor Captain verbally acknowledged these results and is arranging for the patient to be seen in the [facility] ED directly from the CT department. Electronically Signed   By: Beckie Salts M.D.   On: 08/14/2018 14:12    Time Spent in minutes  30   Susa Raring M.D on 08/15/2018 at 9:55 AM  To page go to [email or web address] - password [facility]

## 2018-08-15 NOTE — Progress Notes (Signed)
  Echocardiogram 2D Echocardiogram has been performed.  [name] 08/15/2018, 10:50 AM

## 2018-08-15 NOTE — Progress Notes (Signed)
ANTICOAGULATION CONSULT NOTE  Pharmacy Consult for heparin and apixaban Indication: pulmonary embolus and RLE DVT  Allergies  Allergen Reactions  . Dilaudid [Hydromorphone Hcl]   . Septra [Sulfamethoxazole-Trimethoprim]   . Other Rash    ALPHA GAL RED MEATS    Patient Measurements: Height: 5\' 6"  (167.6 cm) Weight: 253 lb 12.8 oz (115.1 kg) IBW/kg (Calculated) : 59.3 Heparin Dosing Weight: 86.4 kg  Vital Signs: Temp: 99.4 F (37.4 C) (03/07 2012) Temp Source: Oral (03/07 2012) BP: 114/74 (03/07 2012) Pulse Rate: 111 (03/07 2012)  Labs: Recent Labs    08/14/18 1251 08/14/18 1417 08/14/18 1830  08/15/18 0339 08/15/18 1232 08/15/18 1948  HGB  --  15.1*  --   --  13.9  --   --   HCT  --  43.9  --   --  41.0  --   --   PLT  --  226  --   --  210  --   --   LABPROT  --   --  14.2  --   --   --   --   INR  --   --  1.1  --   --   --   --   HEPARINUNFRC  --   --   --    < > 0.28* 0.28* 0.38  CREATININE 1.00 1.10*  --   --  1.06*  --   --   TROPONINI  --  0.27*  --   --   --   --   --    < > = values in this interval not displayed.    Estimated Creatinine Clearance: 81.8 mL/min (A) (by C-G formula based on SCr of 1.06 mg/dL (H)).   Medications:  Infusions:  . heparin 1,700 Units/hr (08/15/18 1806)  . lactated ringers 50 mL/hr at 08/15/18 1254    Assessment: 52 yof presenting with acute, marked bilateral PE with RHS (RV/LV ratio = 2.16) and RLE DVT. Pharmacy consulted to dose IV heparin with plan to switch to Eliquis on 3/8 AM. Not on anticoagulation PTA.  Heparin level is therapeutic at 0.38, on 1700 units/hr. Hgb 13.9, plt 210. No s/sx of bleeding. No infusion issues.   Goal of Therapy:  Heparin level 0.3-0.7 units/ml Monitor platelets by anticoagulation protocol: Yes   Plan:  Continue heparin drip at 1700 units/hr Daily heparin level and CBC Monitor for s/sx of bleeding Transition to Eliquis 10 mg PO BID x7 days, then 5 mg PO BID on 3/8 AM  Thanks for  allowing pharmacy to be a part of this patient's care.  Sherron Monday, PharmD, BCCCP Clinical Pharmacist  Pager: [phone] Phone: [phone] 08/15/2018     8:22 PM

## 2018-08-15 NOTE — Progress Notes (Signed)
ANTICOAGULATION CONSULT NOTE  Pharmacy Consult for heparin Indication: pulmonary embolus  Allergies  Allergen Reactions  . Dilaudid [Hydromorphone Hcl]   . Septra [Sulfamethoxazole-Trimethoprim]   . Other Rash    ALPHA GAL RED MEATS    Patient Measurements: Height: 5\' 6"  (167.6 cm) Weight: 253 lb 12.8 oz (115.1 kg) IBW/kg (Calculated) : 59.3 Heparin Dosing Weight: 86 kg  Vital Signs: Temp: 98 F (36.7 C) (03/06 2222) Temp Source: Oral (03/06 2222) BP: 117/74 (03/06 2222) Pulse Rate: 116 (03/06 2222)  Labs: Recent Labs    08/14/18 1251 08/14/18 1417 08/14/18 1830 08/14/18 2025 08/15/18 0339  HGB  --  15.1*  --   --  13.9  HCT  --  43.9  --   --  41.0  PLT  --  226  --   --  210  LABPROT  --   --  14.2  --   --   INR  --   --  1.1  --   --   HEPARINUNFRC  --   --   --  0.45 0.28*  CREATININE 1.00 1.10*  --   --   --   TROPONINI  --  0.27*  --   --   --     Estimated Creatinine Clearance: 78.8 mL/min (A) (by C-G formula based on SCr of 1.1 mg/dL (H)).   Medications:  Infusions:  . heparin 1,500 Units/hr (08/14/18 1445)    Assessment: 52 yof presenting with acute, marked bilateral PE with RHS (RV/LV ratio = 2.16). Pharmacy consulted to dose IV heparin. Not on anticoagulation PTA.  Heparin level is 0.28 on 1500 units/hr. No bleeding noted  Goal of Therapy:  Heparin level 0.3-0.7 units/ml Monitor platelets by anticoagulation protocol: Yes   Plan:  Increase heparin drip to 1600 units/hr Check heparin level in 6 hours Daily heparin level and CBC Monitor for s/sx of bleeding F/U transition to oral agent   Thanks for allowing pharmacy to be a part of this patient's care.  Talbert Cage, PharmD Clinical Pharmacist

## 2018-08-15 NOTE — Progress Notes (Signed)
VASCULAR LAB PRELIMINARY  PRELIMINARY  PRELIMINARY  PRELIMINARY  Bilateral lower extremity venous duplex completed.    Preliminary report:  See CV proc for preliminary results  Rafe Mackowski, RVT 08/15/2018, 11:31 AM

## 2018-08-15 NOTE — Care Management Note (Signed)
Case Management Note  Patient Details  Name: MELIA LIEBLER MRN: [id number] Date of Birth: 20-Jul-1965  Subjective/Objective:     Admitted with Acute hypoxic respiratory failure due to large saddle PE.  Hx of hypothyroidism, HTN, on contraceptive pills, asthma. Lives alone. PTA independent with ADL's , no DME usage.                 PCP:  Dr. Renne Crigler  Action/Plan: Transition to home with self care when medically stable. Pt has health insurance/ Rx coverage. Pt to d/c on Eliquis. NCM explained and  provide pt with Eliquis 30 day free coupon card and $10.00 copay card for refills.   Pt states she has transportation to home.  Expected Discharge Date:                 Expected Discharge Plan:  Home/Self Care  In-House Referral:  NA  Discharge planning Services  CM Consult(Eliquis  30 day supply coupon, please explain to the patient and family that future refills by PCP.)  Post Acute Care Choice:  NA Choice offered to:  NA  DME Arranged:  N/A DME Agency:  NA  HH Arranged:  NA HH Agency:  NA  Status of Service:  Completed, signed off  If discussed at Long Length of Stay Meetings, dates discussed:    Additional Comments:  Epifanio Lesches, RN 08/15/2018, 1:00 PM

## 2018-08-16 LAB — BASIC METABOLIC PANEL
Anion gap: 10 (ref 5–15)
BUN: 12 mg/dL (ref 6–20)
CHLORIDE: 105 mmol/L (ref 98–111)
CO2: 20 mmol/L — ABNORMAL LOW (ref 22–32)
Calcium: 7.7 mg/dL — ABNORMAL LOW (ref 8.9–10.3)
Creatinine, Ser: 1.01 mg/dL — ABNORMAL HIGH (ref 0.44–1.00)
GFR calc Af Amer: >60 mL/min (ref >60)
GFR calc non Af Amer: >60 mL/min (ref >60)
Glucose, Bld: 144 mg/dL — ABNORMAL HIGH (ref 70–99)
Potassium: 3.5 mmol/L (ref 3.5–5.1)
Sodium: 135 mmol/L (ref 135–145)

## 2018-08-16 LAB — CBC
HCT: 39.2 % (ref 36.0–46.0)
Hemoglobin: 12.8 g/dL (ref 12.0–15.0)
MCH: 28.4 pg (ref 26.0–34.0)
MCHC: 32.7 g/dL (ref 30.0–36.0)
MCV: 86.9 fL (ref 80.0–100.0)
Platelets: 196 10*3/uL (ref 150–400)
RBC: 4.51 MIL/uL (ref 3.87–5.11)
RDW: 12.5 % (ref 11.5–15.5)
WBC: 8.9 10*3/uL (ref 4.0–10.5)
nRBC: 0 % (ref 0.0–0.2)

## 2018-08-16 LAB — TROPONIN I: Troponin I: 0.06 ng/mL (ref <0.03)

## 2018-08-16 LAB — HEPARIN LEVEL (UNFRACTIONATED): Heparin Unfractionated: 0.48 IU/mL (ref 0.30–0.70)

## 2018-08-16 MED ORDER — MAGNESIUM SULFATE IN D5W 1-5 GM/100ML-% IV SOLN
1.0000 g | Freq: Once | INTRAVENOUS | Status: AC
  Administered 2018-08-16: 1 g via INTRAVENOUS
  Filled 2018-08-16: qty 100

## 2018-08-16 MED ORDER — ALPRAZOLAM 0.5 MG PO TABS
0.5000 mg | ORAL_TABLET | Freq: Once | ORAL | Status: DC
  Filled 2018-08-16: qty 1

## 2018-08-16 MED ORDER — METOPROLOL TARTRATE 25 MG PO TABS
25.0000 mg | ORAL_TABLET | Freq: Two times a day (BID) | ORAL | Status: DC
  Administered 2018-08-16 – 2018-08-17 (×2): 25 mg via ORAL
  Filled 2018-08-16 (×2): qty 1

## 2018-08-16 MED ORDER — APIXABAN 5 MG PO TABS: ORAL_TABLET | ORAL | 0 refills | Status: AC

## 2018-08-16 NOTE — Progress Notes (Signed)
Patient  complaint of cough with low grade, notified physician on call requesting a flu pannel. No new order received.

## 2018-08-16 NOTE — Progress Notes (Addendum)
ANTICOAGULATION CONSULT NOTE  Pharmacy Consult for heparin and apixaban Indication: pulmonary embolus and RLE DVT  Allergies  Allergen Reactions  . Dilaudid [Hydromorphone Hcl]   . Septra [Sulfamethoxazole-Trimethoprim]   . Other Rash    ALPHA GAL RED MEATS    Patient Measurements: Height: 5\' 6"  (167.6 cm) Weight: 253 lb 12.8 oz (115.1 kg) IBW/kg (Calculated) : 59.3 Heparin Dosing Weight: 86.4 kg  Vital Signs: Temp: 99.5 F (37.5 C) (03/08 0428) Temp Source: Oral (03/08 0428) BP: 113/68 (03/08 0428) Pulse Rate: 115 (03/08 0428)  Labs: Recent Labs    08/14/18 1417 08/14/18 1830  08/15/18 0339 08/15/18 1232 08/15/18 1948 08/16/18 0536  HGB 15.1*  --   --  13.9  --   --  12.8  HCT 43.9  --   --  41.0  --   --  39.2  PLT 226  --   --  210  --   --  196  LABPROT  --  14.2  --   --   --   --   --   INR  --  1.1  --   --   --   --   --   HEPARINUNFRC  --   --    < > 0.28* 0.28* 0.38 0.48  CREATININE 1.10*  --   --  1.06*  --   --  1.01*  TROPONINI 0.27*  --   --   --   --   --   --    < > = values in this interval not displayed.    Estimated Creatinine Clearance: 85.8 mL/min (A) (by C-G formula based on SCr of 1.01 mg/dL (H)).   Medications:  Infusions:  . lactated ringers 50 mL/hr at 08/15/18 1254    Assessment: 52 yof presenting with acute, marked bilateral PE with RHS (RV/LV ratio = 2.16) and RLE DVT. Pharmacy consulted to dose IV heparin with plan to switch to Eliquis on 3/8 AM. Not on anticoagulation PTA.  Heparin level is therapeutic at 0.48, on 1700 units/hr. Hgb 12.8, plt 196. No s/sx of bleeding. No infusion issues. Heparin drip can be turned off and Eliquis can be started at time of heparin discontinuation.   Goal of Therapy:  Therapeutic anticoagulation   Plan:  Stop heparin gtt Start Eliquis 10 mg PO BID x7 days, then 5 mg PO BID Monitor for s/sx of bleeding Pharmacy will sign off at this time.  Thanks for allowing pharmacy to be a part of this  patient's care.  Arvilla Market, PharmD PGY1 Pharmacy Resident Phone [phone] 08/16/2018     8:06 AM

## 2018-08-16 NOTE — Discharge Instructions (Signed)
Follow with Primary MD Thayer Headings, MD (Inactive) in 7 days   Get CBC, CMP checked  by Primary MD in 5-7 days  Activity: As tolerated with Full fall precautions use walker/cane & assistance as needed  Disposition Home   Diet: Heart Healthy   Special Instructions: If you have smoked or chewed Tobacco  in the last 2 yrs please stop smoking, stop any regular Alcohol  and or any Recreational drug use.  On your next visit with your primary care physician please Get Medicines reviewed and adjusted.  Please request your Prim.MD to go over all Hospital Tests and Procedure/Radiological results at the follow up, please get all Hospital records sent to your Prim MD by signing hospital release before you go home.  If you experience worsening of your admission symptoms, develop shortness of breath, life threatening emergency, suicidal or homicidal thoughts you must seek medical attention immediately by calling 911 or calling your MD immediately  if symptoms less severe.  You Must read complete instructions/literature along with all the possible adverse reactions/side effects for all the Medicines you take and that have been prescribed to you. Take any new Medicines after you have completely understood and accpet all the possible adverse reactions/side effects.                                                                                [facility]                            [address]                            [place], [place] [address]      [name] was admitted to the Hospital on 08/14/2018 and Discharged  08/16/2018 and should be excused from work/school   for 14 days starting from date -  08/14/2018 , may return to work/school without any restrictions.  Call Susa Raring MD, [facility]  [phone] with questions.  Susa Raring M.D on 08/16/2018,at 8:01 AM  [facility]   Office  [phone]     Information on my medicine - ELIQUIS  (apixaban)  Why was Eliquis prescribed for you? Eliquis was prescribed to treat blood clots that may have been found in the veins of your legs (deep vein thrombosis) or in your lungs (pulmonary embolism) and to reduce the risk of them occurring again.  What do You need to know about Eliquis ? The starting dose is 10 mg (two 5 mg tablets) taken TWICE daily for the FIRST SEVEN (7) DAYS, then on 08/23/18  the dose is reduced to ONE 5 mg tablet taken TWICE daily.  Eliquis may be taken with or without food.   Try to take the dose about the same time in the morning and in the evening. If you have difficulty swallowing the tablet whole please discuss with your pharmacist how to take the medication safely.  Take Eliquis exactly as prescribed and DO NOT stop taking Eliquis without talking to the doctor who prescribed the medication.  Stopping may increase your risk of  developing a new blood clot.  Refill your prescription before you run out.  After discharge, you should have regular check-up appointments with your healthcare provider that is prescribing your Eliquis.    What do you do if you miss a dose? If a dose of ELIQUIS is not taken at the scheduled time, take it as soon as possible on the same day and twice-daily administration should be resumed. The dose should not be doubled to make up for a missed dose.  Important Safety Information A possible side effect of Eliquis is bleeding. You should call your healthcare provider right away if you experience any of the following: ? Bleeding from an injury or your nose that does not stop. ? Unusual colored urine (red or dark brown) or unusual colored stools (red or black). ? Unusual bruising for unknown reasons. ? A serious fall or if you hit your head (even if there is no bleeding).  Some medicines may interact with Eliquis and might increase your risk of bleeding or clotting while on Eliquis. To help avoid this, consult your healthcare  provider or pharmacist prior to using any new prescription or non-prescription medications, including herbals, vitamins, non-steroidal anti-inflammatory drugs (NSAIDs) and supplements.  This website has more information on Eliquis (apixaban): http://www.eliquis.com/eliquis/home

## 2018-08-16 NOTE — Discharge Summary (Signed)
[name] ZOX:[id number] DOB: [birth year] DOA: 08/14/2018  PCP: Thayer Headings, MD (Inactive)  Admit date: 08/14/2018  Discharge date: 08/17/2018  Admitted From: Home   Disposition:  Home   Recommendations for Outpatient Follow-up:   Follow up with PCP in 1-2 weeks  PCP Please obtain BMP/CBC, 2 view CXR in 1week,  (see Discharge instructions)   PCP Please follow up on the following pending results: Obtain echocardiogram in 2 weeks, review CT findings and echocardiogram findings.   Home Health: None   Equipment/Devices: None  Consultations: None Discharge Condition: Stable   CODE STATUS: Full   Diet Recommendation: Heart Healthy   Chief Complaint  Patient presents with  . Shortness of Breath     Brief history of present illness from the day of admission and additional interim summary    [name] a 53 y.o.femalewith medical history significant ofhypothyroidism, HTN, on contraceptive pills, asthma who presents for SOB and palpitations.  Symptoms have been gradually progressive for a week, in the ER work-up suggestive of large saddle PE and she was admitted to the hospital.                                                                 Hospital Course     1.  Acute hypoxic respiratory failure due to large saddle PE with right leg DVT.    More dynamically stable, no chest pain, no shortness of breath or oxygen demand, was treated with 24 hours of heparin drip there after 24 hours of Eliquis.  Completely stable and symptom-free except for mild nonproductive cough.  Discharged on Eliquis.  Follow with PCP in a week request PCP to arrange for outpatient echocardiogram in 2 to 3 weeks to document stability.  Echocardiogram done here showed mild to moderate reduction in RV systolic function suggesting the  presence of saddle PE which was also noted on CTA.  Her troponin trend was flat, first set of troponin was 0.07 next was 0.06.  Her case was also discussed with critical care physician Dr. Daleen Bo on 08/17/2018 who agreed with the plan no further interventions.  2. ? renal mass.    Ruled out on CT pelvis, nonspecific findings in the ureter request PCP to monitor and follow.  May benefit from one-time outpatient urology follow-up.  3.  Obesity.  Follow with PCP for weight loss.  BMI was 40.  4.  GERD.  On Zantac.  5.  Essential hypertension.    She was on HCTZ and ACE inhibitor combination, here she was on Norvasc and beta-blocker combination, will discharge her on low-dose beta-blocker only as present blood pressures do not require 2 different blood pressure medications.  PCP to monitor and adjust.   Discharge diagnosis     Active Problems:   Gastroesophageal reflux  disease   Pulmonary embolism [facility])    Discharge instructions    Discharge Instructions    Diet - low sodium heart healthy   Complete by:  As directed    Discharge instructions   Complete by:  As directed    Follow with Primary MD Thayer Headings, MD (Inactive) in 7 days   Get CBC, CMP checked  by Primary MD in 5-7 days  Activity: As tolerated with Full fall precautions use walker/cane & assistance as needed  Disposition Home   Diet: Heart Healthy   Special Instructions: If you have smoked or chewed Tobacco  in the last 2 yrs please stop smoking, stop any regular Alcohol  and or any Recreational drug use.  On your next visit with your primary care physician please Get Medicines reviewed and adjusted.  Please request your Prim.MD to go over all Hospital Tests and Procedure/Radiological results at the follow up, please get all Hospital records sent to your Prim MD by signing hospital release before you go home.  If you experience worsening of your admission symptoms, develop shortness of breath, life threatening  emergency, suicidal or homicidal thoughts you must seek medical attention immediately by calling 911 or calling your MD immediately  if symptoms less severe.  You Must read complete instructions/literature along with all the possible adverse reactions/side effects for all the Medicines you take and that have been prescribed to you. Take any new Medicines after you have completely understood and accpet all the possible adverse reactions/side effects.                                                                                [name]                            [address]                            [place], [place] [address]      [name] was admitted to the Hospital on 08/14/2018 and Discharged  08/16/2018 and should be excused from work/school   for 14 days starting from date -  08/14/2018 , may return to work/school without any restrictions.  Call Susa Raring MD, [facility]  [phone] with questions.  Susa Raring M.D on 08/16/2018,at 8:01 AM  [facility]   Office  [phone]   Increase activity slowly   Complete by:  As directed       Discharge Medications   Allergies as of 08/17/2018      Reactions   Dilaudid [hydromorphone Hcl]    Septra [sulfamethoxazole-trimethoprim]    Other Rash   ALPHA GAL RED MEATS      Medication List    STOP taking these medications   diphenhydrAMINE 25 MG tablet Commonly known as:  BENADRYL   lisinopril-hydrochlorothiazide 10-12.5 MG tablet Commonly known as:  PRINZIDE,ZESTORETIC   predniSONE 10 MG tablet Commonly known as:  DELTASONE     TAKE these medications   albuterol 108 (90 Base) MCG/ACT inhaler Commonly known as:  ProAir HFA Inhale 4 puffs into the  lungs every 4 (four) hours as needed for wheezing or shortness of breath.   apixaban 5 MG Tabs tablet Commonly known as:  ELIQUIS Take 10mg  by mouth twice a day daily, on 08/23/18 switch to 5mg  twice daily.   famotidine 20 MG  tablet Commonly known as:  PEPCID Take 1 tablet (20 mg total) by mouth 2 (two) times daily. What changed:  Another medication with the same name was removed. Continue taking this medication, and follow the directions you see here.   Fluticasone Furoate 200 MCG/ACT Aepb Commonly known as:  Arnuity Ellipta Inhale 1 puff into the lungs daily.   levothyroxine 150 MCG tablet Commonly known as:  SYNTHROID, LEVOTHROID Take 150 mcg by mouth daily.   Lo Loestrin Fe 1 MG-10 MCG / 10 MCG tablet Generic drug:  Norethindrone-Ethinyl Estradiol-Fe Biphas Take 1 tablet by mouth daily.   metoprolol tartrate 25 MG tablet Commonly known as:  LOPRESSOR Take 1 tablet (25 mg total) by mouth 2 (two) times daily.       Follow-up Information    Thayer Headings, MD. Schedule an appointment as soon as possible for a visit in 1 week(s).   Specialty:  Internal Medicine Contact information: [address] [place] [address] [phone]           Major procedures and Radiology Reports - PLEASE review detailed and final reports thoroughly  -      TTE    1. The left ventricle has normal systolic function with an ejection fraction of 60-65%. The cavity size was normal. Left ventricular diastolic Doppler parameters are consistent with impaired relaxation Indeterminent filling pressures The E/e' is 8-15.  There is right ventricular volume/pressure overload.  2. The right ventricle has moderately reduced systolic function. The cavity was mildly enlarged. There is no increase in right ventricular wall thickness. The function of the right ventricular apex is hyperdynamic.  3. The aortic valve was not well visualized.  4. The aortic root and ascending aorta are normal in size and structure.  5. The interatrial septum was not well visualized.    Ct Abdomen Pelvis W Wo Contrast  Result Date: 08/15/2018 CLINICAL DATA:  Questionable left renal mass on recent chest CT. Acute pulmonary  embolism. EXAM: CT ABDOMEN AND PELVIS WITHOUT AND WITH CONTRAST TECHNIQUE: Multidetector CT imaging of the abdomen and pelvis was performed following the standard protocol before and following the bolus administration of intravenous contrast. CONTRAST:  OMNIPAQUE IOHEXOL 300 MG/ML  SOLN COMPARISON:  08/14/2018 chest CT angiogram. 01/19/2004 CT abdomen/pelvis. FINDINGS: Lower chest: No acute abnormality at the lung bases. Partially visualized saddle pulmonary embolus and bilateral central pulmonary emboli appear slightly decreased from chest CT angiogram study performed yesterday. Hepatobiliary: Diffuse hepatic steatosis. Normal liver size with no definite liver surface irregularity. No liver mass. Normal gallbladder with no radiopaque cholelithiasis. No biliary ductal dilatation. Pancreas: Normal, with no mass or duct dilation. Spleen: Normal size. No mass. Adrenals/Urinary Tract: Normal adrenals. Asymmetric severe atrophy and renal cortical scarring in the right kidney. Numerous nonobstructing stones throughout the right kidney, largest 1.5 cm in the lower right kidney. No right hydronephrosis. Nonspecific urothelial wall thickening in the right renal sinus and proximal right ureter, similar to 2005 CT. Mild renal cortical scarring in the upper left kidney. No left hydronephrosis. Scattered small parenchymal calcifications in the left kidney without definite left renal stones. Subcentimeter hypodense renal cortical lesions in the upper and lower left kidney, too small to characterize, which require no  follow-up. Normal nondistended bladder. Stomach/Bowel: Small hiatal hernia. Otherwise normal nondistended stomach. Normal caliber small bowel with no small bowel wall thickening. Normal appendix. Normal large bowel with no diverticulosis, large bowel wall thickening or pericolonic fat stranding. Vascular/Lymphatic: Atherosclerotic nonaneurysmal abdominal aorta. Patent portal, splenic, hepatic and renal veins. No  pathologically enlarged lymph nodes in the abdomen or pelvis. Reproductive: Mildly enlarged globular uterus, can not exclude uterine fibroids. No adnexal masses. Other: No pneumoperitoneum, ascites or focal fluid collection. Musculoskeletal: No aggressive appearing focal osseous lesions. Moderate to severe thoracolumbar spondylosis, most prominent at L5-S1. IMPRESSION: 1. No suspicious renal masses. Bilateral renal cortical scarring, severe on the right and mild on the left. No hydronephrosis. 2. Nonspecific chronic urothelial wall thickening in right renal pelvis and proximal right ureter, similar to a 2005 CT, presumably inflammatory. Nonobstructing right nephrolithiasis. 3. Redemonstration of saddle and central pulmonary emboli at the lung bases, slightly decreased from chest CT study performed 1 day prior. 4. Small hiatal hernia. 5. Diffuse hepatic steatosis. 6. Mildly enlarged globular uterus, could not exclude uterine fibroids. Ultrasound correlation may be obtained as clinically warranted on an outpatient basis. 7.  Aortic Atherosclerosis (ICD10-I70.0). Electronically Signed   By: Delbert Phenix M.D.   On: 08/15/2018 17:01   Ct Angio Chest Pe W Or Wo Contrast  Result Date: 08/14/2018 CLINICAL DATA:  Chest pain and shortness of breath. Lower leg pain. EXAM: CT ANGIOGRAPHY CHEST WITH CONTRAST TECHNIQUE: Multidetector CT imaging of the chest was performed using the standard protocol during bolus administration of intravenous contrast. Multiplanar CT image reconstructions and MIPs were obtained to evaluate the vascular anatomy. CONTRAST:  26mL OMNIPAQUE IOHEXOL 350 MG/ML SOLN COMPARISON:  Abdomen and pelvis CT dated 01/19/2004. FINDINGS: Cardiovascular: Multiple large emboli involving the main pulmonary arteries and their branches as well as segmental and subsegmental branches bilaterally. Significant dilatation of the right ventricle relative to the left ventricle with a right ventricular to left ventricular  ratio of 2.16. Mediastinum/Nodes: No enlarged mediastinal, hilar, or axillary lymph nodes. Surgical clips in the expected position of the thyroid gland. Lungs/Pleura: Small amount of patchy and confluent density in the superior aspect of the superior segment of the left lower lobe. Otherwise, clear lungs. No pleural fluid. Upper Abdomen: Diffuse low density of the liver relative to the spleen. 2 small right renal calculi, the larger measuring 6 mm. The included portion of the right kidney is also smaller than the left kidney. There is also a partially included solid mass-like area in the mid left kidney anteriorly and laterally measuring 3.6 x 2.9 cm on image number 95 series 5. 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 Since this is not included in its entirety, an interval mass can not be excluded. Musculoskeletal: Thoracic and lower cervical spine degenerative changes. No evidence of bony metastatic disease. Review of the MIP images confirms the above findings. IMPRESSION: 1. Positive for acute, marked bilateral pulmonary embolism with CT evidence of right heart strain (RV/LV Ratio = 2.16) consistent with at least submassive (intermediate risk) PE. The presence of right heart strain has been associated with an increased risk of morbidity and mortality. Please activate Code PE by paging [phone]. 2. 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 However, since this is not included in its entirety today,  a mass can not be excluded. Further evaluation with an abdomen and pelvis CT without and with contrast or MRI of the abdomen without and with contrast is recommended. 3. Two small, nonobstructing right renal calculi. 4. Right renal atrophy. 5. Diffuse hepatic steatosis. Critical  Value/emergent results were called by telephone at the time of interpretation on 08/14/2018 at 1:56 pm to Arthor Captain, PA in the [facility] Emergency Department in Dr. Irena Reichmann' absence and due to inability to reach an on call provider for Dr. Thomasena Edis. Arthor Captain verbally acknowledged these results and is arranging for the patient to be seen in the [facility] ED directly from the CT department. Electronically Signed   By: Beckie Salts M.D.   On: 08/14/2018 14:12   Vas [place] Lower Extremity Venous (dvt)  Result Date: 08/15/2018  Lower Venous Study Indications: Pain.  Risk Factors: Confirmed PE. Comparison Study: No prior study on file Performing Technologist: Sherren Kerns RVS  Examination Guidelines: A complete evaluation includes B-mode imaging, spectral Doppler, color Doppler, and power Doppler as needed of all accessible portions of each vessel. Bilateral testing is considered an integral part of a complete examination. Limited examinations for reoccurring indications may be performed as noted.  Right Venous Findings: +---------+---------------+---------+-----------+----------+-------+          CompressibilityPhasicitySpontaneityPropertiesSummary +---------+---------------+---------+-----------+----------+-------+ CFV      Full           Yes      Yes                          +---------+---------------+---------+-----------+----------+-------+ SFJ      Full                                                 +---------+---------------+---------+-----------+----------+-------+ FV Prox  Full                                                 +---------+---------------+---------+-----------+----------+-------+ FV Mid   Full                                                 +---------+---------------+---------+-----------+----------+-------+ FV DistalFull                                                 +---------+---------------+---------+-----------+----------+-------+ PFV      Full                                                  +---------+---------------+---------+-----------+----------+-------+ POP      None           No       No                   Acute   +---------+---------------+---------+-----------+----------+-------+ PTV      None  Acute   +---------+---------------+---------+-----------+----------+-------+ PERO     None                                         Acute   +---------+---------------+---------+-----------+----------+-------+ Gastroc  None                                         Acute   +---------+---------------+---------+-----------+----------+-------+  Left Venous Findings: +---------+---------------+---------+-----------+----------+-------+          CompressibilityPhasicitySpontaneityPropertiesSummary +---------+---------------+---------+-----------+----------+-------+ CFV      Full           Yes      Yes                          +---------+---------------+---------+-----------+----------+-------+ SFJ      Full                                                 +---------+---------------+---------+-----------+----------+-------+ FV Prox  Full                                                 +---------+---------------+---------+-----------+----------+-------+ FV Mid   Full                                                 +---------+---------------+---------+-----------+----------+-------+ FV DistalFull                                                 +---------+---------------+---------+-----------+----------+-------+ PFV      Full                                                 +---------+---------------+---------+-----------+----------+-------+ POP      Full           Yes      Yes                          +---------+---------------+---------+-----------+----------+-------+ PTV      Full                                                  +---------+---------------+---------+-----------+----------+-------+ PERO     Full                                                 +---------+---------------+---------+-----------+----------+-------+  Summary: Right: Findings consistent with acute deep vein thrombosis involving the right popliteal vein, right posterior tibial vein, right peroneal vein, and right gastrocnemius vein. Left: There is no evidence of deep vein thrombosis in the lower extremity.  *See table(s) above for measurements and observations. Electronically signed by Sherald Hess MD on 08/15/2018 at 4:27:34 PM.    Final     Micro Results     No results found for this or any previous visit (from the past 240 hour(s)).  Today   Subjective    Mckenzey Sloan today has no headache,no chest abdominal pain,no new weakness tingling or numbness, feels much better wants to go home today.    Objective   Blood pressure 108/65, pulse 90, temperature 98.2 F (36.8 C), temperature source Oral, resp. rate 14, height  (1.676 m), weight 115.1 kg, last menstrual period 08/10/2018, SpO2 97 % on RA.   Intake/Output Summary (Last 24 hours) at 08/17/2018 0811 Last data filed at 08/16/2018 2100 Gross per 24 hour  Intake 480 ml  Output 1 ml  Net 479 ml    Exam Awake Alert, Oriented x 3, No new F.N deficits, Normal affect [place].AT,PERRAL Supple Neck,No JVD, No cervical lymphadenopathy appriciated.  Symmetrical Chest wall movement, Good air movement bilaterally, CTAB RRR,No Gallops,Rubs or new Murmurs, No Parasternal Heave +ve B.Sounds, Abd Soft, Non tender, No organomegaly appriciated, No rebound -guarding or rigidity. No Cyanosis, Clubbing or edema, mild R leg swelling,   Data Review   CBC w Diff:  Lab Results  Component Value Date   WBC 8.9 08/17/2018   HGB 12.6 08/17/2018   HCT 38.2 08/17/2018   PLT 206 08/17/2018   LYMPHOPCT 24 08/14/2018   MONOPCT 4 08/14/2018   EOSPCT 0 08/14/2018   BASOPCT 0 08/14/2018    CMP:   Lab Results  Component Value Date   NA 136 08/17/2018   K 4.0 08/17/2018   CL 103 08/17/2018   CO2 22 08/17/2018   BUN 11 08/17/2018   CREATININE 0.84 08/17/2018  .   Total Time in preparing paper work, data evaluation and todays exam - 35 minutes  Susa Raring M.D on 08/17/2018 at 8:11 AM  [facility]   Office  [phone]

## 2018-08-16 NOTE — Progress Notes (Signed)
PROGRESS NOTE                                                                                                                                                                                                             Patient Demographics:    [name], is a 53 y.o. female, DOB - 04/01/1966, AVW:[id number]  Admit date - 08/14/2018   Admitting Physician Inez Catalina, MD  Outpatient Primary MD for the patient is Thayer Headings, MD (Inactive)  LOS - 2  Chief Complaint  Patient presents with  . Shortness of Breath       Brief Narrative - [name] is a 53 y.o. female with medical history significant of hypothyroidism, HTN, on contraceptive pills, asthma who presents for SOB and palpitations.  Symptoms have been gradually progressive for a week, in the ER work-up suggestive of large saddle PE and she was admitted to the hospital.   Subjective:   Patient in bed, appears comfortable, denies any headache, no fever, no chest pain or pressure, no shortness of breath , no abdominal pain. No focal weakness.      Assessment  & Plan :     1. Acute hypoxic respiratory failure due to large saddle PE.  Was treated with 24 hours on heparin drip, hemodynamically stable except for mild tachycardia, noted lower extremity venous duplex with large lower extremity clot, echocardiogram noted with some reduction in RV systolic function, she is on room air, symptom-free however still remains mildly tachycardic.  Will switch to Eliquis on 08/16/2018 and observe another 24 hours.  2. ? renal mass.  Ruled out with CT abdomen pelvis, nonspecific ureter changes to be followed by PCP outpatient.  3.  Obesity.  Follow with PCP for weight loss.  BMI was 40.  4.  GERD.  On Zantac.  5.  Essential hypertension.  For now placed on Norvasc.  Will hold HCTZ and ACE inhibitor since she has been exposed to quite a bit of IV dye.    Family Communication  :  None  Code Status :  Full  Disposition Plan   :  TBD  Consults  :  None  Procedures  :    CT Chest - 1. Positive for acute, marked bilateral pulmonary embolism with CT evidence of right heart strain (RV/LV Ratio = 2.16) consistent with at least submassive (intermediate risk) PE. The presence of right heart strain has been associated with an increased risk of morbidity and mortality. Please activate  Code PE by paging [phone]. 2. 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 However, since this is not included in its entirety today, a mass can not be excluded. Further evaluation with an abdomen and pelvis CT without and with contrast or MRI of the abdomen without and with contrast is recommended. 3. Two small, nonobstructing right renal calculi. 4. Right renal atrophy. 5. Diffuse hepatic steatosis.   TTE -   1. The left ventricle has normal systolic function with an ejection fraction of 60-65%. The cavity size was normal. Left ventricular diastolic Doppler parameters are consistent with impaired relaxation Indeterminent filling pressures The E/e' is 8-15.  There is right ventricular volume/pressure overload.  2. The right ventricle has moderately reduced systolic function. The cavity was mildly enlarged. There is no increase in right ventricular wall thickness. The function of the right ventricular apex is hyperdynamic.  3. The aortic valve was not well visualized.  4. The aortic root and ascending aorta are normal in size and structure.  5. The interatrial septum was not well visualized.  Leg [place] - Large R.Leg DVT  CT Abd - 1. No suspicious renal masses. Bilateral renal cortical scarring, severe on the right and mild on the left. No hydronephrosis. 2. Nonspecific chronic urothelial wall thickening in right renal pelvis and proximal right ureter, similar to a 2005 CT, presumably inflammatory. Nonobstructing right  nephrolithiasis. 3. Redemonstration of saddle and central pulmonary emboli at the lung bases, slightly decreased from chest CT study performed 1 day prior. 4. Small hiatal hernia. 5. Diffuse hepatic steatosis. 6. Mildly enlarged globular uterus, could not exclude uterine fibroids. Ultrasound correlation may be obtained as clinically warranted on an outpatient basis. 7.  Aortic Atherosclerosis   DVT Prophylaxis  :   Heparin   Lab Results  Component Value Date   PLT 196 08/16/2018    Diet :  Diet Order            Diet - low sodium heart healthy        Diet regular Room service appropriate? Yes; Fluid consistency: Thin  Diet effective now               Inpatient Medications Scheduled Meds: . ALPRAZolam  0.5 mg Oral Once  . amLODipine  10 mg Oral Daily  . apixaban  10 mg Oral Q12H   Followed by  . [START ON 08/23/2018] apixaban  5 mg Oral Q12H  . budesonide (PULMICORT) nebulizer solution  0.5 mg Nebulization BID  . famotidine  20 mg Oral BID  . levothyroxine  150 mcg Oral Q0600  . potassium chloride  40 mEq Oral BID  . sodium chloride flush  3 mL Intravenous Q12H   Continuous Infusions: . lactated ringers 50 mL/hr at 08/15/18 1254   PRN Meds:.acetaminophen **OR** [DISCONTINUED] acetaminophen, albuterol, guaiFENesin-dextromethorphan, [DISCONTINUED] ondansetron **OR** ondansetron (ZOFRAN) IV  Antibiotics  :   Anti-infectives (From admission, onward)   None          Objective:   Vitals:   08/15/18 2012 08/16/18 0428 08/16/18 0711 08/16/18 0937  BP: 114/74 113/68  105/74  Pulse: (!) 111 (!) 115  (!) 111  Resp: Temp: 99.4 F (37.4 C) 99.5 F (37.5 C)  99.2 F (37.3 C)  TempSrc: Oral Oral  Oral  SpO2: 94% 94% 93% 98%  Weight:  Height:        Wt Readings from Last 3 Encounters:  08/14/18 115.1 kg  08/11/18 117 kg  04/09/18 115.8 kg     Intake/Output Summary (Last 24 hours) at 08/16/2018 0953 Last data filed at 08/16/2018 0618 Gross per 24 hour    Intake 1702.35 ml  Output -  Net 1702.35 ml     Physical Exam  Awake Alert, Oriented X 3, No new F.N deficits, Normal affect [place].AT,PERRAL Supple Neck,No JVD, No cervical lymphadenopathy appriciated.  Symmetrical Chest wall movement, Good air movement bilaterally, CTAB Rapid rate RR,No Gallops, Rubs or new Murmurs, No Parasternal Heave +ve B.Sounds, Abd Soft, No tenderness, No organomegaly appriciated, No rebound - guarding or rigidity. No Cyanosis, Clubbing , held right leg edema,     Data Review:    CBC Recent Labs  Lab 08/14/18 1417 08/15/18 0339 08/16/18 0536  WBC 16.7* 14.1* 8.9  HGB 15.1* 13.9 12.8  HCT 43.9 41.0 39.2  PLT 226 210 196  MCV 86.8 86.0 86.9  MCH 29.8 29.1 28.4  MCHC 34.4 33.9 32.7  RDW 12.5 12.5 12.5  LYMPHSABS 4.1*  --   --   MONOABS 0.7  --   --   EOSABS 0.0  --   --   BASOSABS 0.0  --   --     Chemistries  Recent Labs  Lab 08/14/18 1251 08/14/18 1417 08/15/18 0339 08/15/18 0709 08/16/18 0536  NA  --  137 134*  --  135  K  --  3.7 3.1*  --  3.5  CL  --  101 104  --  105  CO2  --  22 18*  --  20*  GLUCOSE  --  153* 165*  --  144*  BUN  --  15 13  --  12  CREATININE 1.00 1.10* 1.06*  --  1.01*  CALCIUM  --  8.2* 7.9*  --  7.7*  MG  --   --   --  2.2  --    ------------------------------------------------------------------------------------------------------------------ No results for input(s): CHOL, HDL, LDLCALC, TRIG, CHOLHDL, LDLDIRECT in the last 72 hours.  No results found for: HGBA1C ------------------------------------------------------------------------------------------------------------------ Recent Labs    08/15/18 0339  TSH 15.375*   ------------------------------------------------------------------------------------------------------------------ No results for input(s): VITAMINB12, FOLATE, FERRITIN, TIBC, IRON, RETICCTPCT in the last 72 hours.  Coagulation profile Recent Labs  Lab 08/14/18 1830  INR 1.1     No results for input(s): DDIMER in the last 72 hours.  Cardiac Enzymes Recent Labs  Lab 08/14/18 1417  TROPONINI 0.27*   ------------------------------------------------------------------------------------------------------------------ No results found for: BNP  Micro Results No results found for this or any previous visit (from the past 240 hour(s)).  Radiology Reports Ct Abdomen Pelvis W Wo Contrast  Result Date: 08/15/2018 CLINICAL DATA:  Questionable left renal mass on recent chest CT. Acute pulmonary embolism. EXAM: CT ABDOMEN AND PELVIS WITHOUT AND WITH CONTRAST TECHNIQUE: Multidetector CT imaging of the abdomen and pelvis was performed following the standard protocol before and following the bolus administration of intravenous contrast. CONTRAST:  OMNIPAQUE IOHEXOL 300 MG/ML  SOLN COMPARISON:  08/14/2018 chest CT angiogram. 01/19/2004 CT abdomen/pelvis. FINDINGS: Lower chest: No acute abnormality at the lung bases. Partially visualized saddle pulmonary embolus and bilateral central pulmonary emboli appear slightly decreased from chest CT angiogram study performed yesterday. Hepatobiliary: Diffuse hepatic steatosis. Normal liver size with no definite liver surface irregularity. No liver mass. Normal gallbladder with no radiopaque cholelithiasis. No biliary ductal dilatation. Pancreas: Normal, with  no mass or duct dilation. Spleen: Normal size. No mass. Adrenals/Urinary Tract: Normal adrenals. Asymmetric severe atrophy and renal cortical scarring in the right kidney. Numerous nonobstructing stones throughout the right kidney, largest 1.5 cm in the lower right kidney. No right hydronephrosis. Nonspecific urothelial wall thickening in the right renal sinus and proximal right ureter, similar to 2005 CT. Mild renal cortical scarring in the upper left kidney. No left hydronephrosis. Scattered small parenchymal calcifications in the left kidney without definite left renal stones.  Subcentimeter hypodense renal cortical lesions in the upper and lower left kidney, too small to characterize, which require no follow-up. Normal nondistended bladder. Stomach/Bowel: Small hiatal hernia. Otherwise normal nondistended stomach. Normal caliber small bowel with no small bowel wall thickening. Normal appendix. Normal large bowel with no diverticulosis, large bowel wall thickening or pericolonic fat stranding. Vascular/Lymphatic: Atherosclerotic nonaneurysmal abdominal aorta. Patent portal, splenic, hepatic and renal veins. No pathologically enlarged lymph nodes in the abdomen or pelvis. Reproductive: Mildly enlarged globular uterus, can not exclude uterine fibroids. No adnexal masses. Other: No pneumoperitoneum, ascites or focal fluid collection. Musculoskeletal: No aggressive appearing focal osseous lesions. Moderate to severe thoracolumbar spondylosis, most prominent at L5-S1. IMPRESSION: 1. No suspicious renal masses. Bilateral renal cortical scarring, severe on the right and mild on the left. No hydronephrosis. 2. Nonspecific chronic urothelial wall thickening in right renal pelvis and proximal right ureter, similar to a 2005 CT, presumably inflammatory. Nonobstructing right nephrolithiasis. 3. Redemonstration of saddle and central pulmonary emboli at the lung bases, slightly decreased from chest CT study performed 1 day prior. 4. Small hiatal hernia. 5. Diffuse hepatic steatosis. 6. Mildly enlarged globular uterus, could not exclude uterine fibroids. Ultrasound correlation may be obtained as clinically warranted on an outpatient basis. 7.  Aortic Atherosclerosis (ICD10-I70.0). Electronically Signed   By: Delbert Phenix M.D.   On: 08/15/2018 17:01   Ct Angio Chest Pe W Or Wo Contrast  Result Date: 08/14/2018 CLINICAL DATA:  Chest pain and shortness of breath. Lower leg pain. EXAM: CT ANGIOGRAPHY CHEST WITH CONTRAST TECHNIQUE: Multidetector CT imaging of the chest was performed using the standard  protocol during bolus administration of intravenous contrast. Multiplanar CT image reconstructions and MIPs were obtained to evaluate the vascular anatomy. CONTRAST:  75mL OMNIPAQUE IOHEXOL 350 MG/ML SOLN COMPARISON:  Abdomen and pelvis CT dated 01/19/2004. FINDINGS: Cardiovascular: Multiple large emboli involving the main pulmonary arteries and their branches as well as segmental and subsegmental branches bilaterally. Significant dilatation of the right ventricle relative to the left ventricle with a right ventricular to left ventricular ratio of 2.16. Mediastinum/Nodes: No enlarged mediastinal, hilar, or axillary lymph nodes. Surgical clips in the expected position of the thyroid gland. Lungs/Pleura: Small amount of patchy and confluent density in the superior aspect of the superior segment of the left lower lobe. Otherwise, clear lungs. No pleural fluid. Upper Abdomen: Diffuse low density of the liver relative to the spleen. 2 small right renal calculi, the larger measuring 6 mm. The included portion of the right kidney is also smaller than the left kidney. There is also a partially included solid mass-like area in the mid left kidney anteriorly and laterally measuring 3.6 x 2.9 cm on image number 95 series 5. 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 Since this is not included in its entirety, an interval mass can not be excluded. Musculoskeletal: Thoracic and lower cervical spine degenerative changes. No  evidence of bony metastatic disease. Review of the MIP images confirms the above findings. IMPRESSION: 1. Positive for acute, marked bilateral pulmonary embolism with CT evidence of right heart strain (RV/LV Ratio = 2.16) consistent with at least submassive (intermediate risk) PE. The presence of right heart strain has been associated with an increased risk of morbidity and mortality. Please activate  Code PE by paging [phone]. 2. 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 However, since this is not included in its entirety today, a mass can not be excluded. Further evaluation with an abdomen and pelvis CT without and with contrast or MRI of the abdomen without and with contrast is recommended. 3. Two small, nonobstructing right renal calculi. 4. Right renal atrophy. 5. Diffuse hepatic steatosis. Critical Value/emergent results were called by telephone at the time of interpretation on 08/14/2018 at 1:56 pm to Arthor Captain, PA in the [facility] Emergency Department in Dr. Irena Reichmann' absence and due to inability to reach an on call provider for Dr. Thomasena Edis. Arthor Captain verbally acknowledged these results and is arranging for the patient to be seen in the [facility] ED directly from the CT department. Electronically Signed   By: Beckie Salts M.D.   On: 08/14/2018 14:12   Vas [place] Lower Extremity Venous (dvt)  Result Date: 08/15/2018  Lower Venous Study Indications: Pain.  Risk Factors: Confirmed PE. Comparison Study: No prior study on file Performing Technologist: Sherren Kerns RVS  Examination Guidelines: A complete evaluation includes B-mode imaging, spectral Doppler, color Doppler, and power Doppler as needed of all accessible portions of each vessel. Bilateral testing is considered an integral part of a complete examination. Limited examinations for reoccurring indications may be performed as noted.  Right Venous Findings: +---------+---------------+---------+-----------+----------+-------+          CompressibilityPhasicitySpontaneityPropertiesSummary +---------+---------------+---------+-----------+----------+-------+ CFV      Full           Yes      Yes                          +---------+---------------+---------+-----------+----------+-------+ SFJ      Full                                                  +---------+---------------+---------+-----------+----------+-------+ FV Prox  Full                                                 +---------+---------------+---------+-----------+----------+-------+ FV Mid   Full                                                 +---------+---------------+---------+-----------+----------+-------+ FV DistalFull                                                 +---------+---------------+---------+-----------+----------+-------+ PFV      Full                                                 +---------+---------------+---------+-----------+----------+-------+  POP      None           No       No                   Acute   +---------+---------------+---------+-----------+----------+-------+ PTV      None                                         Acute   +---------+---------------+---------+-----------+----------+-------+ PERO     None                                         Acute   +---------+---------------+---------+-----------+----------+-------+ Gastroc  None                                         Acute   +---------+---------------+---------+-----------+----------+-------+  Left Venous Findings: +---------+---------------+---------+-----------+----------+-------+          CompressibilityPhasicitySpontaneityPropertiesSummary +---------+---------------+---------+-----------+----------+-------+ CFV      Full           Yes      Yes                          +---------+---------------+---------+-----------+----------+-------+ SFJ      Full                                                 +---------+---------------+---------+-----------+----------+-------+ FV Prox  Full                                                 +---------+---------------+---------+-----------+----------+-------+ FV Mid   Full                                                  +---------+---------------+---------+-----------+----------+-------+ FV DistalFull                                                 +---------+---------------+---------+-----------+----------+-------+ PFV      Full                                                 +---------+---------------+---------+-----------+----------+-------+ POP      Full           Yes      Yes                          +---------+---------------+---------+-----------+----------+-------+ PTV      Full                                                 +---------+---------------+---------+-----------+----------+-------+  PERO     Full                                                 +---------+---------------+---------+-----------+----------+-------+    Summary: Right: Findings consistent with acute deep vein thrombosis involving the right popliteal vein, right posterior tibial vein, right peroneal vein, and right gastrocnemius vein. Left: There is no evidence of deep vein thrombosis in the lower extremity.  *See table(s) above for measurements and observations. Electronically signed by Sherald Hesshristopher Clark MD on 08/15/2018 at 4:27:34 PM.    Final     Time Spent in minutes  30   Susa RaringPrashant Daaiel Starlin M.D on 08/16/2018 at 9:53 AM  To page go to [email or web address] - password [email or web address]

## 2018-08-16 NOTE — Progress Notes (Signed)
Received call from tele that patient had a 6 beat run of v-tach. Checked on patient and she was coughing heavily. Robitussin given. MD paged and new orders received. Will continue to monitor.

## 2018-08-17 LAB — CBC
HCT: 38.2 % (ref 36.0–46.0)
Hemoglobin: 12.6 g/dL (ref 12.0–15.0)
MCH: 28.8 pg (ref 26.0–34.0)
MCHC: 33 g/dL (ref 30.0–36.0)
MCV: 87.4 fL (ref 80.0–100.0)
Platelets: 206 10*3/uL (ref 150–400)
RBC: 4.37 MIL/uL (ref 3.87–5.11)
RDW: 12.5 % (ref 11.5–15.5)
WBC: 8.9 10*3/uL (ref 4.0–10.5)
nRBC: 0 % (ref 0.0–0.2)

## 2018-08-17 LAB — BASIC METABOLIC PANEL
Anion gap: 11 (ref 5–15)
BUN: 11 mg/dL (ref 6–20)
CHLORIDE: 103 mmol/L (ref 98–111)
CO2: 22 mmol/L (ref 22–32)
Calcium: 7.8 mg/dL — ABNORMAL LOW (ref 8.9–10.3)
Creatinine, Ser: 0.84 mg/dL (ref 0.44–1.00)
GFR calc Af Amer: >60 mL/min (ref >60)
GFR calc non Af Amer: >60 mL/min (ref >60)
Glucose, Bld: 144 mg/dL — ABNORMAL HIGH (ref 70–99)
Potassium: 4 mmol/L (ref 3.5–5.1)
SODIUM: 136 mmol/L (ref 135–145)

## 2018-08-17 LAB — MAGNESIUM: Magnesium: 1.9 mg/dL (ref 1.7–2.4)

## 2018-08-17 LAB — HOMOCYSTEINE: Homocysteine: 11.6 umol/L (ref 0.0–14.5)

## 2018-08-17 MED ORDER — METOPROLOL TARTRATE 25 MG PO TABS: 25.0000 mg | ORAL_TABLET | Freq: Two times a day (BID) | ORAL | 0 refills | Status: AC

## 2018-08-17 NOTE — Progress Notes (Signed)
[name] Box to be D/C'd home per MD order. SWOT RN completed discharge. VVS, Skin clean, dry and intact without evidence of skin break down, no evidence of skin tears noted.  An After Visit Summary was printed and given to the patient.  Patient escorted via WC, and D/C home via private auto.  Jon Gills  08/17/2018 11:26 AM

## 2018-08-19 LAB — PROTHROMBIN GENE MUTATION

## 2018-08-19 LAB — FACTOR 5 LEIDEN

## 2018-08-20 DIAGNOSIS — R0602 Shortness of breath: Secondary | ICD-10-CM | POA: Diagnosis not present

## 2018-08-20 DIAGNOSIS — I82451 Acute embolism and thrombosis of right peroneal vein: Secondary | ICD-10-CM | POA: Diagnosis not present

## 2018-08-20 DIAGNOSIS — I82441 Acute embolism and thrombosis of right tibial vein: Secondary | ICD-10-CM | POA: Diagnosis not present

## 2018-08-20 DIAGNOSIS — I824Z1 Acute embolism and thrombosis of unspecified deep veins of right distal lower extremity: Secondary | ICD-10-CM | POA: Diagnosis not present

## 2018-08-20 DIAGNOSIS — Z6841 Body Mass Index (BMI) 40.0 and over, adult: Secondary | ICD-10-CM | POA: Diagnosis not present

## 2018-08-20 DIAGNOSIS — E669 Obesity, unspecified: Secondary | ICD-10-CM | POA: Diagnosis not present

## 2018-08-20 DIAGNOSIS — I081 Rheumatic disorders of both mitral and tricuspid valves: Secondary | ICD-10-CM | POA: Diagnosis not present

## 2018-08-20 DIAGNOSIS — Z87442 Personal history of urinary calculi: Secondary | ICD-10-CM | POA: Diagnosis not present

## 2018-08-20 DIAGNOSIS — I82461 Acute embolism and thrombosis of right calf muscular vein: Secondary | ICD-10-CM | POA: Diagnosis not present

## 2018-08-20 DIAGNOSIS — E890 Postprocedural hypothyroidism: Secondary | ICD-10-CM | POA: Diagnosis not present

## 2018-08-20 DIAGNOSIS — I82431 Acute embolism and thrombosis of right popliteal vein: Secondary | ICD-10-CM | POA: Diagnosis not present

## 2018-08-20 DIAGNOSIS — M545 Low back pain: Secondary | ICD-10-CM | POA: Diagnosis not present

## 2018-08-20 DIAGNOSIS — E039 Hypothyroidism, unspecified: Secondary | ICD-10-CM | POA: Diagnosis not present

## 2018-08-20 DIAGNOSIS — I517 Cardiomegaly: Secondary | ICD-10-CM | POA: Diagnosis not present

## 2018-08-20 DIAGNOSIS — I272 Pulmonary hypertension, unspecified: Secondary | ICD-10-CM | POA: Diagnosis not present

## 2018-08-20 DIAGNOSIS — I2699 Other pulmonary embolism without acute cor pulmonale: Secondary | ICD-10-CM | POA: Diagnosis not present

## 2018-08-21 DIAGNOSIS — I2699 Other pulmonary embolism without acute cor pulmonale: Secondary | ICD-10-CM | POA: Diagnosis not present

## 2018-08-21 DIAGNOSIS — I824Z1 Acute embolism and thrombosis of unspecified deep veins of right distal lower extremity: Secondary | ICD-10-CM | POA: Diagnosis not present

## 2018-08-21 DIAGNOSIS — E039 Hypothyroidism, unspecified: Secondary | ICD-10-CM | POA: Diagnosis not present

## 2018-08-22 DIAGNOSIS — I2699 Other pulmonary embolism without acute cor pulmonale: Secondary | ICD-10-CM | POA: Diagnosis not present

## 2018-08-22 DIAGNOSIS — I824Z1 Acute embolism and thrombosis of unspecified deep veins of right distal lower extremity: Secondary | ICD-10-CM | POA: Diagnosis not present

## 2018-08-22 DIAGNOSIS — E039 Hypothyroidism, unspecified: Secondary | ICD-10-CM | POA: Diagnosis not present

## 2018-08-25 DIAGNOSIS — Z8639 Personal history of other endocrine, nutritional and metabolic disease: Secondary | ICD-10-CM | POA: Diagnosis not present

## 2018-08-25 DIAGNOSIS — I10 Essential (primary) hypertension: Secondary | ICD-10-CM | POA: Diagnosis not present

## 2018-08-25 DIAGNOSIS — E890 Postprocedural hypothyroidism: Secondary | ICD-10-CM | POA: Diagnosis not present

## 2018-08-25 DIAGNOSIS — I2699 Other pulmonary embolism without acute cor pulmonale: Secondary | ICD-10-CM | POA: Diagnosis not present

## 2018-09-01 ENCOUNTER — Institutional Professional Consult (permissible substitution): Payer: Self-pay | Admitting: Pulmonary Disease

## 2018-09-04 DIAGNOSIS — Z888 Allergy status to other drugs, medicaments and biological substances status: Secondary | ICD-10-CM | POA: Diagnosis not present

## 2018-09-04 DIAGNOSIS — K219 Gastro-esophageal reflux disease without esophagitis: Secondary | ICD-10-CM | POA: Diagnosis not present

## 2018-09-04 DIAGNOSIS — Z79899 Other long term (current) drug therapy: Secondary | ICD-10-CM | POA: Diagnosis not present

## 2018-09-04 DIAGNOSIS — I2692 Saddle embolus of pulmonary artery without acute cor pulmonale: Secondary | ICD-10-CM | POA: Diagnosis not present

## 2018-09-04 DIAGNOSIS — R609 Edema, unspecified: Secondary | ICD-10-CM | POA: Diagnosis not present

## 2018-09-04 DIAGNOSIS — E039 Hypothyroidism, unspecified: Secondary | ICD-10-CM | POA: Diagnosis not present

## 2018-09-04 DIAGNOSIS — I82431 Acute embolism and thrombosis of right popliteal vein: Secondary | ICD-10-CM | POA: Diagnosis not present

## 2018-09-04 DIAGNOSIS — R05 Cough: Secondary | ICD-10-CM | POA: Diagnosis not present

## 2018-09-04 DIAGNOSIS — I824Z1 Acute embolism and thrombosis of unspecified deep veins of right distal lower extremity: Secondary | ICD-10-CM | POA: Diagnosis not present

## 2018-09-04 DIAGNOSIS — I82441 Acute embolism and thrombosis of right tibial vein: Secondary | ICD-10-CM | POA: Diagnosis not present

## 2018-09-04 DIAGNOSIS — I82411 Acute embolism and thrombosis of right femoral vein: Secondary | ICD-10-CM | POA: Diagnosis not present

## 2018-09-04 DIAGNOSIS — I2699 Other pulmonary embolism without acute cor pulmonale: Secondary | ICD-10-CM | POA: Diagnosis not present

## 2018-09-04 DIAGNOSIS — R2242 Localized swelling, mass and lump, left lower limb: Secondary | ICD-10-CM | POA: Diagnosis not present

## 2018-09-04 DIAGNOSIS — I10 Essential (primary) hypertension: Secondary | ICD-10-CM | POA: Diagnosis not present

## 2018-09-04 DIAGNOSIS — Z6841 Body Mass Index (BMI) 40.0 and over, adult: Secondary | ICD-10-CM | POA: Diagnosis not present

## 2018-09-05 DIAGNOSIS — E039 Hypothyroidism, unspecified: Secondary | ICD-10-CM | POA: Diagnosis not present

## 2018-09-05 DIAGNOSIS — I2699 Other pulmonary embolism without acute cor pulmonale: Secondary | ICD-10-CM | POA: Diagnosis not present

## 2018-09-05 DIAGNOSIS — I82411 Acute embolism and thrombosis of right femoral vein: Secondary | ICD-10-CM | POA: Diagnosis not present

## 2018-09-05 DIAGNOSIS — I824Z1 Acute embolism and thrombosis of unspecified deep veins of right distal lower extremity: Secondary | ICD-10-CM | POA: Diagnosis not present

## 2018-09-06 DIAGNOSIS — I2699 Other pulmonary embolism without acute cor pulmonale: Secondary | ICD-10-CM | POA: Diagnosis not present

## 2018-09-06 DIAGNOSIS — R05 Cough: Secondary | ICD-10-CM | POA: Diagnosis not present

## 2018-09-06 DIAGNOSIS — I824Z1 Acute embolism and thrombosis of unspecified deep veins of right distal lower extremity: Secondary | ICD-10-CM | POA: Diagnosis not present

## 2018-09-06 DIAGNOSIS — I82411 Acute embolism and thrombosis of right femoral vein: Secondary | ICD-10-CM | POA: Diagnosis not present

## 2018-09-06 DIAGNOSIS — E039 Hypothyroidism, unspecified: Secondary | ICD-10-CM | POA: Diagnosis not present

## 2018-09-07 DIAGNOSIS — I2699 Other pulmonary embolism without acute cor pulmonale: Secondary | ICD-10-CM | POA: Diagnosis not present

## 2018-09-07 DIAGNOSIS — E039 Hypothyroidism, unspecified: Secondary | ICD-10-CM | POA: Diagnosis not present

## 2018-09-07 DIAGNOSIS — I824Z1 Acute embolism and thrombosis of unspecified deep veins of right distal lower extremity: Secondary | ICD-10-CM | POA: Diagnosis not present

## 2018-09-07 DIAGNOSIS — I82411 Acute embolism and thrombosis of right femoral vein: Secondary | ICD-10-CM | POA: Diagnosis not present

## 2018-09-08 DIAGNOSIS — I2602 Saddle embolus of pulmonary artery with acute cor pulmonale: Secondary | ICD-10-CM | POA: Diagnosis not present

## 2018-09-08 DIAGNOSIS — Z86718 Personal history of other venous thrombosis and embolism: Secondary | ICD-10-CM | POA: Diagnosis not present

## 2018-09-08 DIAGNOSIS — I4891 Unspecified atrial fibrillation: Secondary | ICD-10-CM | POA: Diagnosis not present

## 2018-09-08 DIAGNOSIS — Z86711 Personal history of pulmonary embolism: Secondary | ICD-10-CM | POA: Diagnosis not present

## 2018-09-08 DIAGNOSIS — Z7901 Long term (current) use of anticoagulants: Secondary | ICD-10-CM | POA: Diagnosis not present

## 2018-09-08 DIAGNOSIS — I824Z1 Acute embolism and thrombosis of unspecified deep veins of right distal lower extremity: Secondary | ICD-10-CM | POA: Diagnosis not present

## 2018-09-08 DIAGNOSIS — I10 Essential (primary) hypertension: Secondary | ICD-10-CM | POA: Diagnosis not present

## 2018-09-08 DIAGNOSIS — Z09 Encounter for follow-up examination after completed treatment for conditions other than malignant neoplasm: Secondary | ICD-10-CM | POA: Diagnosis not present

## 2018-09-10 DIAGNOSIS — Z5181 Encounter for therapeutic drug level monitoring: Secondary | ICD-10-CM | POA: Diagnosis not present

## 2018-09-10 DIAGNOSIS — I824Z1 Acute embolism and thrombosis of unspecified deep veins of right distal lower extremity: Secondary | ICD-10-CM | POA: Diagnosis not present

## 2018-09-10 DIAGNOSIS — Z7901 Long term (current) use of anticoagulants: Secondary | ICD-10-CM | POA: Diagnosis not present

## 2018-09-10 DIAGNOSIS — I2602 Saddle embolus of pulmonary artery with acute cor pulmonale: Secondary | ICD-10-CM | POA: Diagnosis not present

## 2018-09-14 DIAGNOSIS — I10 Essential (primary) hypertension: Secondary | ICD-10-CM | POA: Diagnosis not present

## 2018-09-14 DIAGNOSIS — E890 Postprocedural hypothyroidism: Secondary | ICD-10-CM | POA: Diagnosis not present

## 2018-09-14 DIAGNOSIS — Z09 Encounter for follow-up examination after completed treatment for conditions other than malignant neoplasm: Secondary | ICD-10-CM | POA: Diagnosis not present

## 2018-11-11 DIAGNOSIS — I10 Essential (primary) hypertension: Secondary | ICD-10-CM | POA: Diagnosis not present

## 2018-11-11 DIAGNOSIS — L247 Irritant contact dermatitis due to plants, except food: Secondary | ICD-10-CM | POA: Diagnosis not present

## 2018-11-17 DIAGNOSIS — Z7901 Long term (current) use of anticoagulants: Secondary | ICD-10-CM | POA: Diagnosis not present

## 2018-11-17 DIAGNOSIS — E890 Postprocedural hypothyroidism: Secondary | ICD-10-CM | POA: Diagnosis not present

## 2018-11-17 DIAGNOSIS — L247 Irritant contact dermatitis due to plants, except food: Secondary | ICD-10-CM | POA: Diagnosis not present

## 2018-11-17 DIAGNOSIS — I10 Essential (primary) hypertension: Secondary | ICD-10-CM | POA: Diagnosis not present

## 2018-11-17 DIAGNOSIS — R7309 Other abnormal glucose: Secondary | ICD-10-CM | POA: Diagnosis not present

## 2018-11-20 DIAGNOSIS — F331 Major depressive disorder, recurrent, moderate: Secondary | ICD-10-CM | POA: Diagnosis not present

## 2018-11-26 DIAGNOSIS — I10 Essential (primary) hypertension: Secondary | ICD-10-CM | POA: Diagnosis not present

## 2018-11-26 DIAGNOSIS — R7301 Impaired fasting glucose: Secondary | ICD-10-CM | POA: Diagnosis not present

## 2018-12-03 DIAGNOSIS — I10 Essential (primary) hypertension: Secondary | ICD-10-CM | POA: Diagnosis not present

## 2018-12-03 DIAGNOSIS — E119 Type 2 diabetes mellitus without complications: Secondary | ICD-10-CM | POA: Diagnosis not present

## 2018-12-04 DIAGNOSIS — F331 Major depressive disorder, recurrent, moderate: Secondary | ICD-10-CM | POA: Diagnosis not present

## 2018-12-10 DIAGNOSIS — N898 Other specified noninflammatory disorders of vagina: Secondary | ICD-10-CM | POA: Diagnosis not present

## 2018-12-10 DIAGNOSIS — Z309 Encounter for contraceptive management, unspecified: Secondary | ICD-10-CM | POA: Diagnosis not present

## 2018-12-14 DIAGNOSIS — Z7901 Long term (current) use of anticoagulants: Secondary | ICD-10-CM | POA: Diagnosis not present

## 2018-12-14 DIAGNOSIS — I2602 Saddle embolus of pulmonary artery with acute cor pulmonale: Secondary | ICD-10-CM | POA: Diagnosis not present

## 2018-12-14 DIAGNOSIS — I825Z1 Chronic embolism and thrombosis of unspecified deep veins of right distal lower extremity: Secondary | ICD-10-CM | POA: Diagnosis not present

## 2018-12-14 DIAGNOSIS — I824Z1 Acute embolism and thrombosis of unspecified deep veins of right distal lower extremity: Secondary | ICD-10-CM | POA: Diagnosis not present

## 2018-12-14 DIAGNOSIS — I10 Essential (primary) hypertension: Secondary | ICD-10-CM | POA: Diagnosis not present

## 2018-12-16 DIAGNOSIS — F331 Major depressive disorder, recurrent, moderate: Secondary | ICD-10-CM | POA: Diagnosis not present

## 2018-12-22 DIAGNOSIS — I824Z1 Acute embolism and thrombosis of unspecified deep veins of right distal lower extremity: Secondary | ICD-10-CM | POA: Diagnosis not present

## 2018-12-22 DIAGNOSIS — I2602 Saddle embolus of pulmonary artery with acute cor pulmonale: Secondary | ICD-10-CM | POA: Diagnosis not present

## 2018-12-22 DIAGNOSIS — I10 Essential (primary) hypertension: Secondary | ICD-10-CM | POA: Diagnosis not present

## 2018-12-22 DIAGNOSIS — R0600 Dyspnea, unspecified: Secondary | ICD-10-CM | POA: Diagnosis not present

## 2018-12-24 DIAGNOSIS — F331 Major depressive disorder, recurrent, moderate: Secondary | ICD-10-CM | POA: Diagnosis not present

## 2018-12-27 DIAGNOSIS — F331 Major depressive disorder, recurrent, moderate: Secondary | ICD-10-CM | POA: Diagnosis not present

## 2019-01-13 DIAGNOSIS — F332 Major depressive disorder, recurrent severe without psychotic features: Secondary | ICD-10-CM | POA: Diagnosis not present

## 2019-01-13 DIAGNOSIS — F411 Generalized anxiety disorder: Secondary | ICD-10-CM | POA: Diagnosis not present

## 2019-01-16 DIAGNOSIS — F331 Major depressive disorder, recurrent, moderate: Secondary | ICD-10-CM | POA: Diagnosis not present

## 2019-01-28 DIAGNOSIS — Z6838 Body mass index (BMI) 38.0-38.9, adult: Secondary | ICD-10-CM | POA: Diagnosis not present

## 2019-01-28 DIAGNOSIS — Z1231 Encounter for screening mammogram for malignant neoplasm of breast: Secondary | ICD-10-CM | POA: Diagnosis not present

## 2019-01-28 DIAGNOSIS — Z124 Encounter for screening for malignant neoplasm of cervix: Secondary | ICD-10-CM | POA: Diagnosis not present

## 2019-01-28 DIAGNOSIS — Z01419 Encounter for gynecological examination (general) (routine) without abnormal findings: Secondary | ICD-10-CM | POA: Diagnosis not present

## 2019-01-28 DIAGNOSIS — Z3202 Encounter for pregnancy test, result negative: Secondary | ICD-10-CM | POA: Diagnosis not present

## 2019-01-30 DIAGNOSIS — F331 Major depressive disorder, recurrent, moderate: Secondary | ICD-10-CM | POA: Diagnosis not present

## 2019-02-10 ENCOUNTER — Ambulatory Visit: Payer: BLUE CROSS/BLUE SHIELD | Admitting: Allergy & Immunology

## 2019-02-11 DIAGNOSIS — F330 Major depressive disorder, recurrent, mild: Secondary | ICD-10-CM | POA: Diagnosis not present

## 2019-02-26 DIAGNOSIS — I10 Essential (primary) hypertension: Secondary | ICD-10-CM | POA: Diagnosis not present

## 2019-02-26 DIAGNOSIS — E890 Postprocedural hypothyroidism: Secondary | ICD-10-CM | POA: Diagnosis not present

## 2019-02-26 DIAGNOSIS — Z7901 Long term (current) use of anticoagulants: Secondary | ICD-10-CM | POA: Diagnosis not present

## 2019-02-26 DIAGNOSIS — E119 Type 2 diabetes mellitus without complications: Secondary | ICD-10-CM | POA: Diagnosis not present

## 2019-02-26 DIAGNOSIS — Z6838 Body mass index (BMI) 38.0-38.9, adult: Secondary | ICD-10-CM | POA: Diagnosis not present

## 2019-02-26 DIAGNOSIS — Z0000 Encounter for general adult medical examination without abnormal findings: Secondary | ICD-10-CM | POA: Diagnosis not present

## 2019-02-27 DIAGNOSIS — F330 Major depressive disorder, recurrent, mild: Secondary | ICD-10-CM | POA: Diagnosis not present

## 2019-03-03 DIAGNOSIS — Z7901 Long term (current) use of anticoagulants: Secondary | ICD-10-CM | POA: Diagnosis not present

## 2019-03-03 DIAGNOSIS — Z09 Encounter for follow-up examination after completed treatment for conditions other than malignant neoplasm: Secondary | ICD-10-CM | POA: Diagnosis not present

## 2019-03-03 DIAGNOSIS — Z86718 Personal history of other venous thrombosis and embolism: Secondary | ICD-10-CM | POA: Diagnosis not present

## 2019-03-03 DIAGNOSIS — I4891 Unspecified atrial fibrillation: Secondary | ICD-10-CM | POA: Diagnosis not present

## 2019-03-03 DIAGNOSIS — Z86711 Personal history of pulmonary embolism: Secondary | ICD-10-CM | POA: Diagnosis not present

## 2019-03-03 DIAGNOSIS — I2602 Saddle embolus of pulmonary artery with acute cor pulmonale: Secondary | ICD-10-CM | POA: Diagnosis not present

## 2019-03-03 DIAGNOSIS — E890 Postprocedural hypothyroidism: Secondary | ICD-10-CM | POA: Diagnosis not present

## 2019-03-03 DIAGNOSIS — R300 Dysuria: Secondary | ICD-10-CM | POA: Diagnosis not present

## 2019-03-03 DIAGNOSIS — I10 Essential (primary) hypertension: Secondary | ICD-10-CM | POA: Diagnosis not present

## 2019-03-03 DIAGNOSIS — I825Z1 Chronic embolism and thrombosis of unspecified deep veins of right distal lower extremity: Secondary | ICD-10-CM | POA: Diagnosis not present

## 2019-03-03 DIAGNOSIS — Z79899 Other long term (current) drug therapy: Secondary | ICD-10-CM | POA: Diagnosis not present

## 2019-03-06 DIAGNOSIS — F331 Major depressive disorder, recurrent, moderate: Secondary | ICD-10-CM | POA: Diagnosis not present

## 2019-03-22 DIAGNOSIS — F411 Generalized anxiety disorder: Secondary | ICD-10-CM | POA: Diagnosis not present

## 2019-03-22 DIAGNOSIS — F332 Major depressive disorder, recurrent severe without psychotic features: Secondary | ICD-10-CM | POA: Diagnosis not present

## 2019-03-25 DIAGNOSIS — F331 Major depressive disorder, recurrent, moderate: Secondary | ICD-10-CM | POA: Diagnosis not present

## 2019-04-02 DIAGNOSIS — F331 Major depressive disorder, recurrent, moderate: Secondary | ICD-10-CM | POA: Diagnosis not present

## 2019-04-06 DIAGNOSIS — Z1211 Encounter for screening for malignant neoplasm of colon: Secondary | ICD-10-CM | POA: Diagnosis not present

## 2019-04-06 DIAGNOSIS — K219 Gastro-esophageal reflux disease without esophagitis: Secondary | ICD-10-CM | POA: Diagnosis not present

## 2019-04-17 DIAGNOSIS — F331 Major depressive disorder, recurrent, moderate: Secondary | ICD-10-CM | POA: Diagnosis not present

## 2019-04-20 DIAGNOSIS — L650 Telogen effluvium: Secondary | ICD-10-CM | POA: Diagnosis not present

## 2019-04-20 DIAGNOSIS — F5105 Insomnia due to other mental disorder: Secondary | ICD-10-CM | POA: Diagnosis not present

## 2019-04-20 DIAGNOSIS — F99 Mental disorder, not otherwise specified: Secondary | ICD-10-CM | POA: Diagnosis not present

## 2019-04-20 DIAGNOSIS — I10 Essential (primary) hypertension: Secondary | ICD-10-CM | POA: Diagnosis not present

## 2019-04-29 DIAGNOSIS — F411 Generalized anxiety disorder: Secondary | ICD-10-CM | POA: Diagnosis not present

## 2019-04-29 DIAGNOSIS — F332 Major depressive disorder, recurrent severe without psychotic features: Secondary | ICD-10-CM | POA: Diagnosis not present

## 2019-05-17 DIAGNOSIS — Z1211 Encounter for screening for malignant neoplasm of colon: Secondary | ICD-10-CM | POA: Diagnosis not present

## 2019-05-30 DIAGNOSIS — I82431 Acute embolism and thrombosis of right popliteal vein: Secondary | ICD-10-CM | POA: Diagnosis not present

## 2019-05-30 DIAGNOSIS — R0602 Shortness of breath: Secondary | ICD-10-CM | POA: Diagnosis not present

## 2019-05-30 DIAGNOSIS — I82411 Acute embolism and thrombosis of right femoral vein: Secondary | ICD-10-CM | POA: Diagnosis not present

## 2019-05-30 DIAGNOSIS — Z20828 Contact with and (suspected) exposure to other viral communicable diseases: Secondary | ICD-10-CM | POA: Diagnosis not present

## 2019-05-30 DIAGNOSIS — R918 Other nonspecific abnormal finding of lung field: Secondary | ICD-10-CM | POA: Diagnosis not present

## 2019-05-30 DIAGNOSIS — R079 Chest pain, unspecified: Secondary | ICD-10-CM | POA: Diagnosis not present

## 2019-05-30 DIAGNOSIS — N200 Calculus of kidney: Secondary | ICD-10-CM | POA: Diagnosis not present

## 2019-05-30 DIAGNOSIS — I825Z1 Chronic embolism and thrombosis of unspecified deep veins of right distal lower extremity: Secondary | ICD-10-CM | POA: Diagnosis not present

## 2019-05-31 DIAGNOSIS — R918 Other nonspecific abnormal finding of lung field: Secondary | ICD-10-CM | POA: Diagnosis not present

## 2019-05-31 DIAGNOSIS — I82411 Acute embolism and thrombosis of right femoral vein: Secondary | ICD-10-CM | POA: Diagnosis not present

## 2019-05-31 DIAGNOSIS — R079 Chest pain, unspecified: Secondary | ICD-10-CM | POA: Diagnosis not present

## 2019-05-31 DIAGNOSIS — I82431 Acute embolism and thrombosis of right popliteal vein: Secondary | ICD-10-CM | POA: Diagnosis not present

## 2019-05-31 DIAGNOSIS — N200 Calculus of kidney: Secondary | ICD-10-CM | POA: Diagnosis not present

## 2019-05-31 DIAGNOSIS — R0602 Shortness of breath: Secondary | ICD-10-CM | POA: Diagnosis not present

## 2019-06-01 DIAGNOSIS — E119 Type 2 diabetes mellitus without complications: Secondary | ICD-10-CM | POA: Diagnosis not present

## 2019-06-01 DIAGNOSIS — I82401 Acute embolism and thrombosis of unspecified deep veins of right lower extremity: Secondary | ICD-10-CM | POA: Diagnosis not present

## 2019-06-01 DIAGNOSIS — Z09 Encounter for follow-up examination after completed treatment for conditions other than malignant neoplasm: Secondary | ICD-10-CM | POA: Diagnosis not present

## 2019-06-01 DIAGNOSIS — N2889 Other specified disorders of kidney and ureter: Secondary | ICD-10-CM | POA: Diagnosis not present

## 2019-06-01 DIAGNOSIS — Z23 Encounter for immunization: Secondary | ICD-10-CM | POA: Diagnosis not present

## 2019-06-01 DIAGNOSIS — I10 Essential (primary) hypertension: Secondary | ICD-10-CM | POA: Diagnosis not present

## 2020-01-08 IMAGING — CT CT ABD-PEL WO/W CM
2 of 10 series · 12 of 46 positions shown, 17 images · IV contrast (omnipaque)
Comparison: 08/14/2018 chest CT angiogram. 01/19/2004 CT
abdomen/pelvis.

CLINICAL DATA: Questionable left renal mass on recent chest CT.
Acute pulmonary embolism.

EXAM:
CT ABDOMEN AND PELVIS WITHOUT AND WITH CONTRAST
TECHNIQUE: Multidetector CT imaging of the abdomen and pelvis was performed
following the standard protocol before and following the bolus
administration of intravenous contrast.
CONTRAST:  100mL OMNIPAQUE IOHEXOL 300 MG/ML  SOLN

[Series 11: nephrographic 3.0 i30f 2 · axial · 0.98mm/px · z∈[+988,+1444]mm · 10 of 184 slices shown, 15 images]
[im 16/184  soft-tissue]
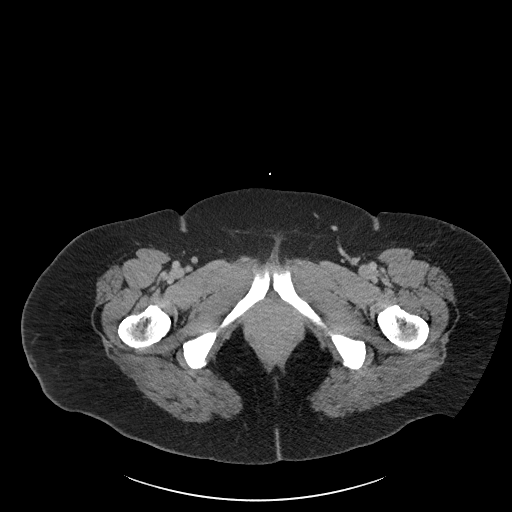
[im 16/184  bone]
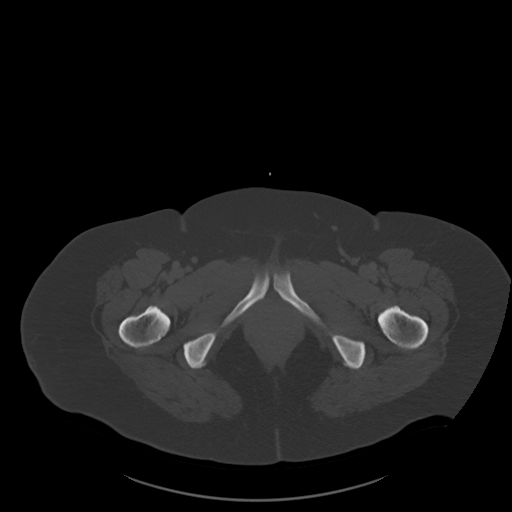
[im 31/184  soft-tissue]
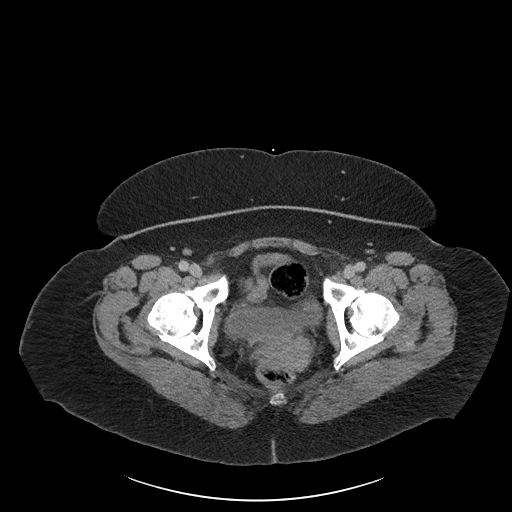
[im 62/184  soft-tissue]
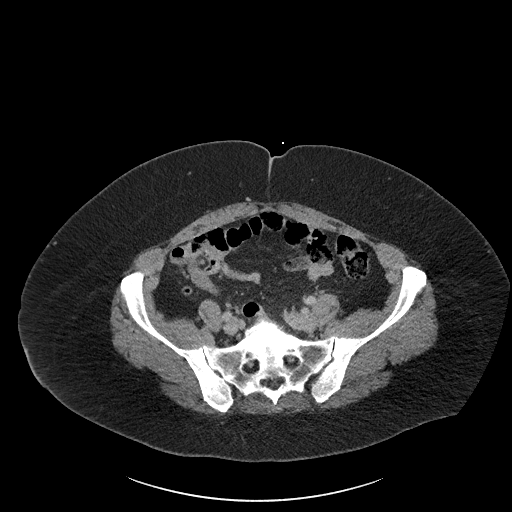
[im 77/184  soft-tissue]
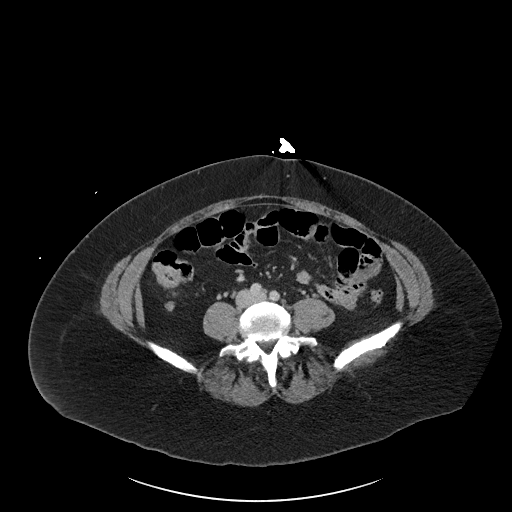
[im 92/184  soft-tissue]
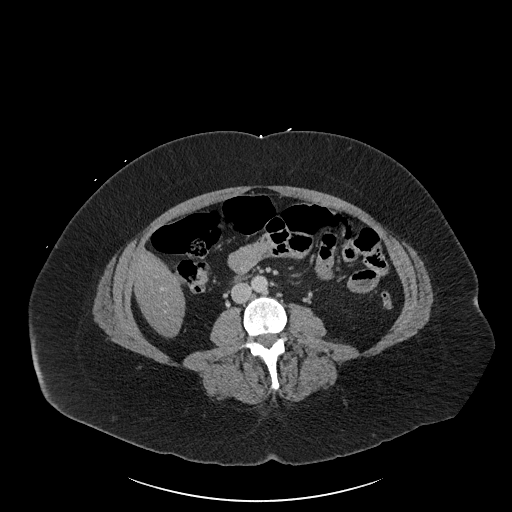
[im 107/184  soft-tissue]
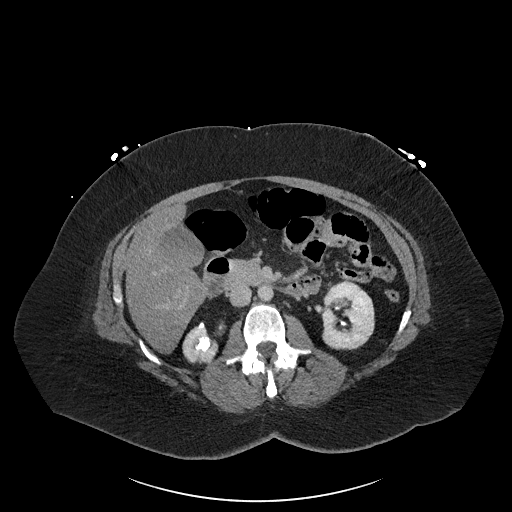
[im 123/184  soft-tissue]
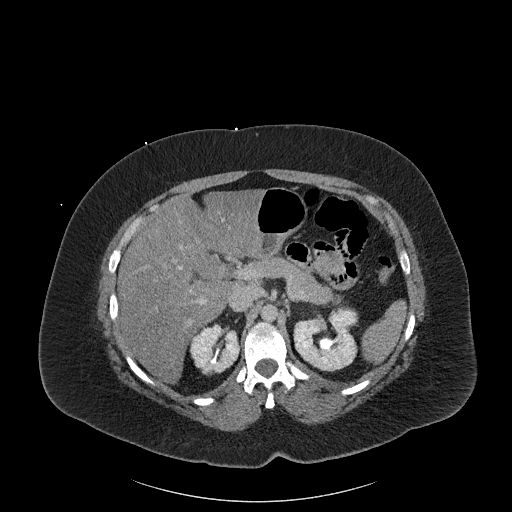
[im 123/184  lung]
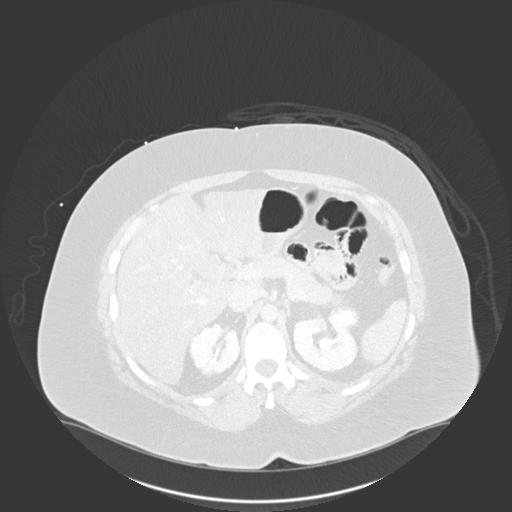
[im 138/184  lung]
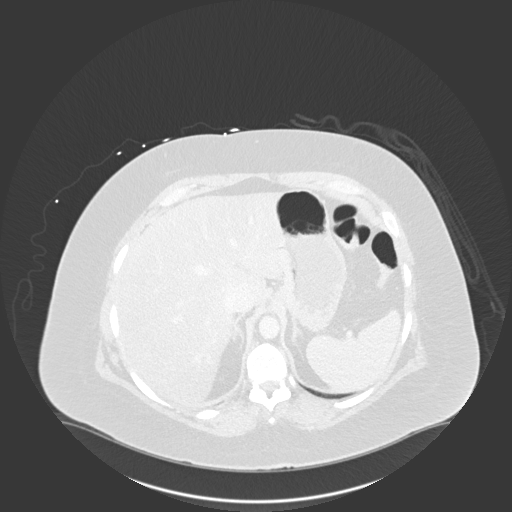
[im 153/184  soft-tissue]
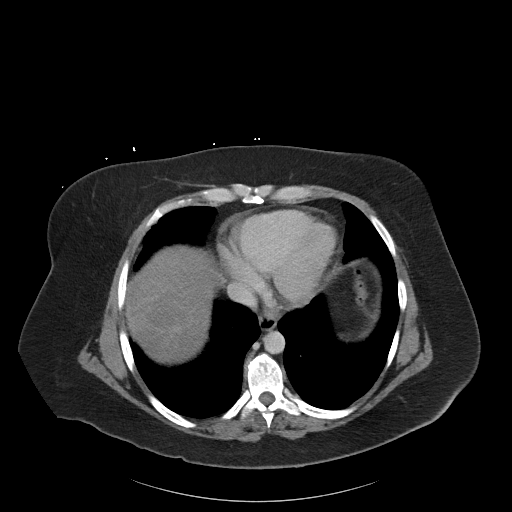
[im 153/184  lung]
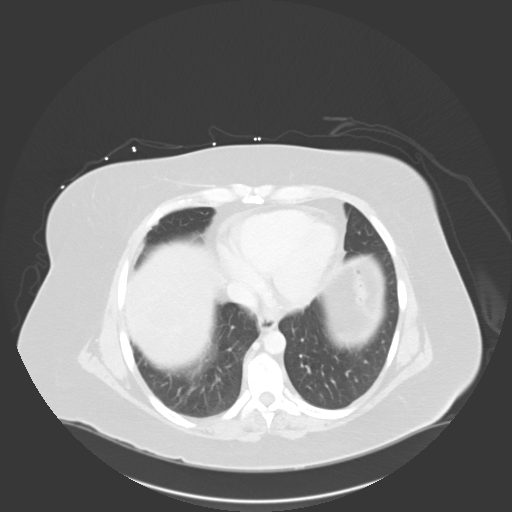
[im 168/184  soft-tissue]
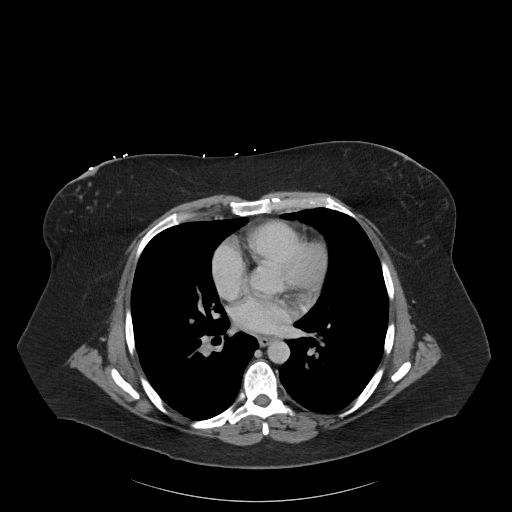
[im 168/184  lung]
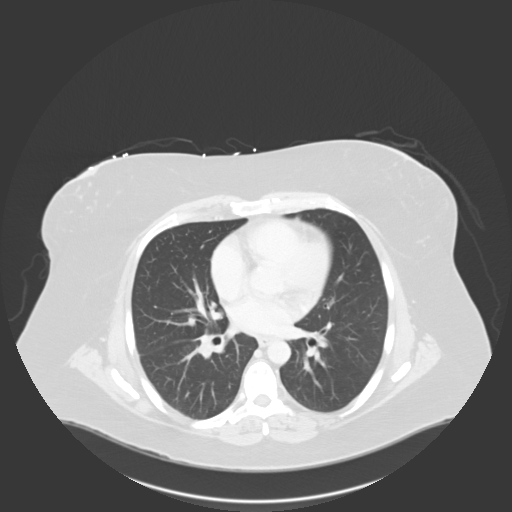
[im 168/184  bone]
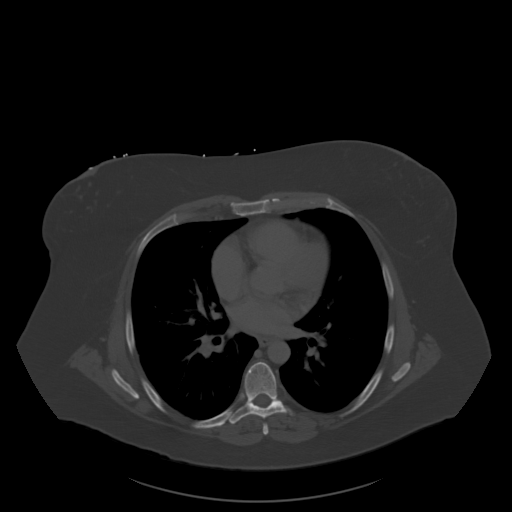

[Series 14: nephrographic coronal · coronal · 1.07mm/px · 2 of 116 slices shown]
[im 39/116  soft-tissue]
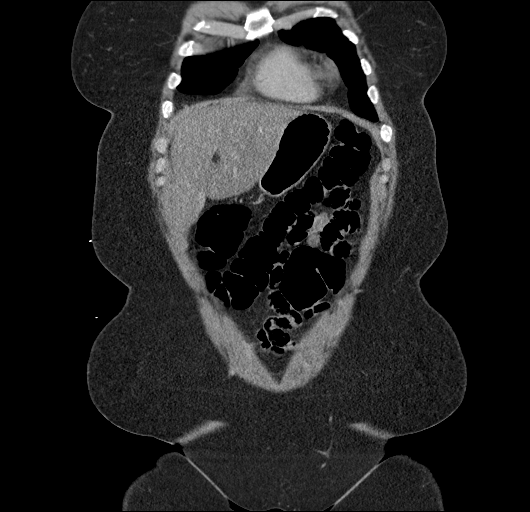
[im 77/116  soft-tissue]
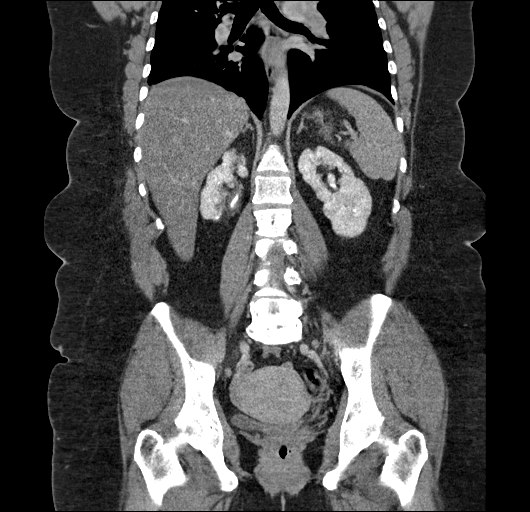

[12 of 46 positions shown; findings below may reference images not displayed]

FINDINGS: Lower chest: No acute abnormality at the lung bases. Partially
visualized saddle pulmonary embolus and bilateral central pulmonary
emboli appear slightly decreased from chest CT angiogram study
performed yesterday.

Hepatobiliary: Diffuse hepatic steatosis. Normal liver size with no
definite liver surface irregularity. No liver mass. Normal
gallbladder with no radiopaque cholelithiasis. No biliary ductal
dilatation.

Pancreas: Normal, with no mass or duct dilation.

Spleen: Normal size. No mass.

Adrenals/Urinary Tract: Normal adrenals. Asymmetric severe atrophy
and renal cortical scarring in the right kidney. Numerous
nonobstructing stones throughout the right kidney, largest 1.5 cm in
the lower right kidney. No right hydronephrosis. Nonspecific
urothelial wall thickening in the right renal sinus and proximal
right ureter, similar to 0666 CT. Mild renal cortical scarring in
the upper left kidney. No left hydronephrosis. Scattered small
parenchymal calcifications in the left kidney without definite left
renal stones. Subcentimeter hypodense renal cortical lesions in the
upper and lower left kidney, too small to characterize, which
require no follow-up. Normal nondistended bladder.

Stomach/Bowel: Small hiatal hernia. Otherwise normal nondistended
stomach. Normal caliber small bowel with no small bowel wall
thickening. Normal appendix. Normal large bowel with no
diverticulosis, large bowel wall thickening or pericolonic fat
stranding.

Vascular/Lymphatic: Atherosclerotic nonaneurysmal abdominal aorta.
Patent portal, splenic, hepatic and renal veins. No pathologically
enlarged lymph nodes in the abdomen or pelvis.

Reproductive: Mildly enlarged globular uterus, can not exclude
uterine fibroids. No adnexal masses.

Other: No pneumoperitoneum, ascites or focal fluid collection.

Musculoskeletal: No aggressive appearing focal osseous lesions.
Moderate to severe thoracolumbar spondylosis, most prominent at
L5-S1.
IMPRESSION: 1. No suspicious renal masses. Bilateral renal cortical scarring,
severe on the right and mild on the left. No hydronephrosis.
2. Nonspecific chronic urothelial wall thickening in right renal
pelvis and proximal right ureter, similar to a 0666 CT, presumably
inflammatory. Nonobstructing right nephrolithiasis.
3. Redemonstration of saddle and central pulmonary emboli at the
lung bases, slightly decreased from chest CT study performed 1 day
prior.
4. Small hiatal hernia.
5. Diffuse hepatic steatosis.
6. Mildly enlarged globular uterus, could not exclude uterine
fibroids. Ultrasound correlation may be obtained as clinically
warranted on an outpatient basis.
7.  Aortic Atherosclerosis ([id number]).

## 2020-03-15 ENCOUNTER — Other Ambulatory Visit: Payer: Self-pay | Admitting: Obstetrics and Gynecology

## 2020-03-15 DIAGNOSIS — R928 Other abnormal and inconclusive findings on diagnostic imaging of breast: Secondary | ICD-10-CM

## 2020-03-21 ENCOUNTER — Ambulatory Visit
Admission: RE | Admit: 2020-03-21 | Discharge: 2020-03-21 | Disposition: A | Discharge status: Home / Self Care | Payer: BC Managed Care – PPO | Source: Ambulatory Visit | Attending: Obstetrics and Gynecology | Admitting: Obstetrics and Gynecology

## 2020-03-21 ENCOUNTER — Ambulatory Visit
Admission: RE | Admit: 2020-03-21 | Discharge: 2020-03-21 | Disposition: A | Discharge status: Home / Self Care | Payer: BLUE CROSS/BLUE SHIELD | Source: Ambulatory Visit | Attending: Obstetrics and Gynecology | Admitting: Obstetrics and Gynecology

## 2020-03-21 ENCOUNTER — Other Ambulatory Visit: Payer: Self-pay

## 2020-03-21 DIAGNOSIS — R928 Other abnormal and inconclusive findings on diagnostic imaging of breast: Secondary | ICD-10-CM
